# Patient Record
Sex: Male | Born: 1980 | Race: White | Hispanic: No | Marital: Single | Smoking: Current every day smoker
Health system: Southern US, Community
[De-identification: ages and names within clinical notes are randomized; demographics above are authoritative.]

## PROBLEM LIST (undated history)

## (undated) DIAGNOSIS — J40 Bronchitis, not specified as acute or chronic: Secondary | ICD-10-CM

## (undated) DIAGNOSIS — J329 Chronic sinusitis, unspecified: Secondary | ICD-10-CM

## (undated) DIAGNOSIS — K589 Irritable bowel syndrome without diarrhea: Secondary | ICD-10-CM

## (undated) DIAGNOSIS — K219 Gastro-esophageal reflux disease without esophagitis: Secondary | ICD-10-CM

## (undated) DIAGNOSIS — Z87442 Personal history of urinary calculi: Secondary | ICD-10-CM

## (undated) DIAGNOSIS — J45909 Unspecified asthma, uncomplicated: Secondary | ICD-10-CM

## (undated) DIAGNOSIS — J189 Pneumonia, unspecified organism: Secondary | ICD-10-CM

## (undated) HISTORY — DX: Irritable bowel syndrome, unspecified: K58.9

## (undated) HISTORY — DX: Gastro-esophageal reflux disease without esophagitis: K21.9

## (undated) HISTORY — PX: APPENDECTOMY: SHX54

## (undated) HISTORY — DX: Personal history of urinary calculi: Z87.442

---

## 2007-09-09 ENCOUNTER — Emergency Department (HOSPITAL_COMMUNITY): Admission: EM | Admit: 2007-09-09 | Discharge: 2007-09-09 | Payer: Self-pay | Admitting: Family Medicine

## 2011-12-06 ENCOUNTER — Encounter (HOSPITAL_COMMUNITY): Payer: Self-pay

## 2011-12-06 ENCOUNTER — Emergency Department (HOSPITAL_COMMUNITY)
Admission: EM | Admit: 2011-12-06 | Discharge: 2011-12-06 | Disposition: A | Discharge status: Home / Self Care | Payer: PRIVATE HEALTH INSURANCE | Attending: Emergency Medicine | Admitting: Emergency Medicine

## 2011-12-06 DIAGNOSIS — M5380 Other specified dorsopathies, site unspecified: Secondary | ICD-10-CM | POA: Insufficient documentation

## 2011-12-06 DIAGNOSIS — J45909 Unspecified asthma, uncomplicated: Secondary | ICD-10-CM | POA: Insufficient documentation

## 2011-12-06 DIAGNOSIS — IMO0002 Reserved for concepts with insufficient information to code with codable children: Secondary | ICD-10-CM | POA: Insufficient documentation

## 2011-12-06 DIAGNOSIS — R51 Headache: Secondary | ICD-10-CM | POA: Insufficient documentation

## 2011-12-06 DIAGNOSIS — F17200 Nicotine dependence, unspecified, uncomplicated: Secondary | ICD-10-CM | POA: Insufficient documentation

## 2011-12-06 DIAGNOSIS — M62830 Muscle spasm of back: Secondary | ICD-10-CM

## 2011-12-06 DIAGNOSIS — Z8709 Personal history of other diseases of the respiratory system: Secondary | ICD-10-CM | POA: Insufficient documentation

## 2011-12-06 DIAGNOSIS — S40811A Abrasion of right upper arm, initial encounter: Secondary | ICD-10-CM

## 2011-12-06 HISTORY — DX: Chronic sinusitis, unspecified: J32.9

## 2011-12-06 HISTORY — DX: Bronchitis, not specified as acute or chronic: J40

## 2011-12-06 HISTORY — DX: Unspecified asthma, uncomplicated: J45.909

## 2011-12-06 MED ORDER — IBUPROFEN 800 MG PO TABS
800.0000 mg | ORAL_TABLET | Freq: Three times a day (TID) | ORAL | Status: DC | PRN
Start: 2011-12-06 — End: 2012-10-27

## 2011-12-06 MED ORDER — IBUPROFEN 200 MG PO TABS
600.0000 mg | ORAL_TABLET | Freq: Once | ORAL | Status: AC
  Administered 2011-12-06: 600 mg via ORAL
  Filled 2011-12-06: qty 3
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
## 2012-10-27 ENCOUNTER — Encounter (HOSPITAL_COMMUNITY): Payer: Self-pay | Admitting: Emergency Medicine

## 2012-10-27 ENCOUNTER — Emergency Department (HOSPITAL_COMMUNITY)
Admission: EM | Admit: 2012-10-27 | Discharge: 2012-10-28 | Disposition: A | Discharge status: Home / Self Care | Payer: 59 | Attending: Emergency Medicine | Admitting: Emergency Medicine

## 2012-10-27 DIAGNOSIS — F17200 Nicotine dependence, unspecified, uncomplicated: Secondary | ICD-10-CM | POA: Insufficient documentation

## 2012-10-27 DIAGNOSIS — F1010 Alcohol abuse, uncomplicated: Secondary | ICD-10-CM | POA: Insufficient documentation

## 2012-10-27 DIAGNOSIS — J45909 Unspecified asthma, uncomplicated: Secondary | ICD-10-CM | POA: Insufficient documentation

## 2012-10-27 LAB — RAPID URINE DRUG SCREEN, HOSP PERFORMED
Amphetamines: NOT DETECTED
Barbiturates: NOT DETECTED
Benzodiazepines: NOT DETECTED
Cocaine: NOT DETECTED
Opiates: NOT DETECTED
Tetrahydrocannabinol: NOT DETECTED

## 2012-10-27 LAB — COMPREHENSIVE METABOLIC PANEL
Albumin: 4.2 g/dL (ref 3.5–5.2)
BUN: 9 mg/dL (ref 6–23)
Calcium: 9.2 mg/dL (ref 8.4–10.5)
Chloride: 104 mEq/L (ref 96–112)
Creatinine, Ser: 0.76 mg/dL (ref 0.50–1.35)
Total Bilirubin: 0.2 mg/dL — ABNORMAL LOW (ref 0.3–1.2)
Total Protein: 8 g/dL (ref 6.0–8.3)

## 2012-10-27 LAB — CBC WITH DIFFERENTIAL/PLATELET
Hemoglobin: 17.5 g/dL — ABNORMAL HIGH (ref 13.0–17.0)
MCV: 89.7 fL (ref 78.0–100.0)
Platelets: 220 10*3/uL (ref 150–400)
RBC: 5.35 MIL/uL (ref 4.22–5.81)
WBC: 8.8 10*3/uL (ref 4.0–10.5)

## 2012-10-27 MED ORDER — ALUM & MAG HYDROXIDE-SIMETH 200-200-20 MG/5ML PO SUSP: 30.0000 mL | ORAL | Status: DC | PRN

## 2012-10-27 MED ORDER — LORAZEPAM 1 MG PO TABS: 1.0000 mg | ORAL_TABLET | Freq: Three times a day (TID) | ORAL | Status: DC | PRN

## 2012-10-27 MED ORDER — ONDANSETRON HCL 4 MG PO TABS: 4.0000 mg | ORAL_TABLET | Freq: Three times a day (TID) | ORAL | Status: DC | PRN

## 2012-10-27 MED ORDER — IBUPROFEN 400 MG PO TABS: 600.0000 mg | ORAL_TABLET | Freq: Three times a day (TID) | ORAL | Status: DC | PRN

## 2012-10-27 MED ORDER — NICOTINE 21 MG/24HR TD PT24: 21.0000 mg | MEDICATED_PATCH | Freq: Every day | TRANSDERMAL | Status: DC

## 2012-10-27 MED ORDER — ACETAMINOPHEN 325 MG PO TABS: 650.0000 mg | ORAL_TABLET | ORAL | Status: DC | PRN

## 2012-10-27 MED ORDER — ZOLPIDEM TARTRATE 5 MG PO TABS: 5.0000 mg | ORAL_TABLET | Freq: Every evening | ORAL | Status: DC | PRN

## 2012-10-27 NOTE — ED Notes (Addendum)
Pt to ED for alcohol detox.  Pt states that he does not drink everyday but when he does he binge drinks and will drink until he passes out- pt estimates that he drinks on average once a week.  Drink of choice is beer.  Pt last drink today at 5pm, states he started this morning and has had 15 pints of beer.  Patient place in paper scrubs and wanded by security.   Denies SI/HI.

## 2012-10-27 NOTE — ED Notes (Signed)
Pt. requesting detox for alcohol abuse , last drink this afternoon , denies suicidal ideation or hallucinations .

## 2012-10-27 NOTE — ED Provider Notes (Signed)
CSN: [id number]     Arrival date & time 10/27/12  2205 History   First MD Initiated Contact with Patient 10/27/12 2217     Chief Complaint  Patient presents with  . Alcohol Problem   (Consider location/radiation/quality/duration/timing/severity/associated sxs/prior Treatment) HPI Comments: Patient is a 32 y/o male with a history of alcohol abuse and family history of alcohol abuse who presents for detox from alcohol. Patient requesting treatment in an alcohol treatment facility. Patient endorses 6 months of sobriety with relapse in March 2014. Patient states he does not drink every day, but when he does he binge drinks. Patient drinks beer and last drank at 5pm today. Patient states he had 11 16-oz beers today. Denies SI/HI/hallucinations and illicit drug use. Denies hx of seizures or seizure activity secondary to detox.  Patient is a 32 y.o. male presenting with alcohol problem. The history is provided by the patient. No language interpreter was used.  Alcohol Problem Pertinent negatives include no fever.    Past Medical History  Diagnosis Date  . Asthma   . Bronchitis   . Sinus infection    Past Surgical History  Procedure Laterality Date  . Appendectomy     No family history on file. History  Substance Use Topics  . Smoking status: Current Every Day Smoker -- 0.50 packs/day  . Smokeless tobacco: Not on file  . Alcohol Use: Yes    Review of Systems  Constitutional: Negative for fever.  Psychiatric/Behavioral: Negative for suicidal ideas. The patient is nervous/anxious.   All other systems reviewed and are negative.    Allergies  Review of patient's allergies indicates no known allergies.  Home Medications  No current outpatient prescriptions on file. BP 125/83  Pulse 114  Temp(Src) 98.7 F (37.1 C) (Oral)  Resp 20  Ht 5\' 9"  (1.753 m)  Wt 278 lb (126.1 kg)  BMI 41.03 kg/m2  SpO2 96%  Physical Exam  Nursing note and vitals reviewed. Constitutional: He is  oriented to person, place, and time. He appears well-developed and well-nourished. No distress.  Patient tearful  HENT:  Head: Normocephalic and atraumatic.  Mouth/Throat: Oropharynx is clear and moist. No oropharyngeal exudate.  Eyes: Conjunctivae and EOM are normal. No scleral icterus.  Neck: Normal range of motion. Neck supple.  Cardiovascular: Normal rate, regular rhythm and normal heart sounds.   Pulmonary/Chest: Effort normal and breath sounds normal. No respiratory distress. He has no wheezes. He has no rales.  Musculoskeletal: Normal range of motion.  Neurological: He is alert and oriented to person, place, and time.  Skin: Skin is warm and dry. No rash noted. He is not diaphoretic. No erythema. No pallor.  Psychiatric: His speech is normal. Thought content normal. His mood appears anxious. Cognition and memory are normal. He expresses no homicidal and no suicidal ideation. He expresses no suicidal plans and no homicidal plans.  Patient tearful and emotional; appears mildly anxious    ED Course  Procedures (including critical care time) Labs Review Labs Reviewed  ETHANOL - Abnormal; Notable for the following:    Alcohol, Ethyl (B) 158 (*)    All other components within normal limits  COMPREHENSIVE METABOLIC PANEL - Abnormal; Notable for the following:    Glucose, Bld 103 (*)    Total Bilirubin 0.2 (*)    All other components within normal limits  CBC WITH DIFFERENTIAL - Abnormal; Notable for the following:    Hemoglobin 17.5 (*)    MCHC 36.5 (*)    All other components  within normal limits  URINE RAPID DRUG SCREEN (HOSP PERFORMED)   Imaging Review No results found.  MDM   1. Alcohol abuse    32 year old male with a history of alcohol abuse presents for detox from alcohol; requesting placement in alcohol rehabilitation facility. Patient denies history of depression and anxiety you know he states that symptoms of these may contribute to his excessive drinking. Patient  tearful and emotional at bedside. Consults to TTS placed. CIWA protocol and temp psych hold orders placed.  6:00 - Patient seen by ACT and opts for outpatient program. Stable and appropriate for d/c.  Antony Madura, PA-C 10/28/12 0602

## 2012-10-28 NOTE — ED Notes (Signed)
Evaluated by Berna Spare with TTS, given option for inpatient versus outpatient and he opts for intensive outpatient program. Resources and referrals provided  Sunnie Nielsen, MD 10/28/12 [phone]

## 2012-10-28 NOTE — ED Notes (Signed)
TTS team at bedside talking to pt and family.

## 2012-10-28 NOTE — ED Notes (Signed)
Pt has ride home.

## 2012-10-28 NOTE — ED Notes (Signed)
Assessment team called Mathew Hale stated have Pt on their list to be assessed.

## 2012-10-28 NOTE — BH Assessment (Signed)
Assessment Note  Mathew Hale is an 32 y.o. male.  Patient came to [facility] seeking assistance with his binge drinking.  Patient is drinking primarily on nights when he is not working.  He will drink 2-3x/W.  Binging consists of drinking 12-18 during a 24 hour period.  "Many times I will drink until I pass out."  Patient has been binging sporatically for the last 2 years with it getting worse over the last few weeks.  Patient last drank at 17:00 on 10/02.  Patient cites depression and financial problems as reasons that he is drinking.  Patient has no previous detox experience and cites no acute detox symptoms.  Patient has gone for 6 months without drinking and said that things went very well but that he picked up the habit again very readily.  Patient says, "I have to get this addressed or it is going to control me."  Patient denies any SI, HI or A/V hallucinations. Patient is open to doing CD IOP program.  He does not meet criteria for inpatient detox since he can go for a few days without drinking with no acute symptoms. Patient care was discussed with Dr. Dierdre Highman who was in agreement with patient going to a IOP program.  Patient was given a list of programs to choose from.  He was also given a list of AA meetings for the next few days.  Patient said that he is going to call about CD IOP this morning at [facility].  Dr. Dierdre Highman d/c'ed patient home.  Axis I: Alcohol Abuse Axis II: Deferred Axis III:  Past Medical History  Diagnosis Date  . Asthma   . Bronchitis   . Sinus infection    Axis IV: economic problems and other psychosocial or environmental problems Axis V: 51-60 moderate symptoms  Past Medical History:  Past Medical History  Diagnosis Date  . Asthma   . Bronchitis   . Sinus infection     Past Surgical History  Procedure Laterality Date  . Appendectomy      Family History: No family history on file.  Social History:  reports that he has been smoking.  He does not have any  smokeless tobacco history on file. He reports that  drinks alcohol. He reports that he does not use illicit drugs.  Additional Social History:  Alcohol / Drug Use Pain Medications: None Prescriptions: None Over the Counter: N/A History of alcohol / drug use?: Yes Longest period of sobriety (when/how long): 6 months Negative Consequences of Use: Personal relationships Withdrawal Symptoms: Blackouts;Nausea / Vomiting;Tremors;Sweats Substance #1 Name of Substance 1: ETOH, beer 1 - Age of First Use: 26 1 - Amount (size/oz): 18-24 beers when binging 1 - Frequency: 2-3 times per week, nights when he does not have to work. 1 - Duration: Last two years 1 - Last Use / Amount: At 17:00 on 10/02  CIWA: CIWA-Ar BP: 125/83 mmHg Pulse Rate: 114 Nausea and Vomiting: no nausea and no vomiting Tactile Disturbances: none Tremor: no tremor Auditory Disturbances: not present Paroxysmal Sweats: barely perceptible sweating, palms moist Visual Disturbances: not present Anxiety: two Headache, Fullness in Head: none present Agitation: normal activity Orientation and Clouding of Sensorium: oriented and can do serial additions CIWA-Ar Total: 3 COWS:    Allergies: No Known Allergies  Home Medications:  (Not in a hospital admission)  OB/GYN Status:  No LMP for male patient.  General Assessment Data Location of Assessment: [facility] ED Is this a Tele or Face-to-Face Assessment?: Face-to-Face Is  this an Initial Assessment or a Re-assessment for this encounter?: Initial Assessment Living Arrangements: Spouse/significant other;Children Can pt return to current living arrangement?: Yes Admission Status: Voluntary Is patient capable of signing voluntary admission?: Yes Transfer from: Acute Hospital Referral Source: Self/Family/Friend     [facility] Crisis Care Plan Living Arrangements: Spouse/significant other;Children Name of Psychiatrist: N/A Name of Therapist: N/A     Risk to self Suicidal Ideation:  No Suicidal Intent: No Is patient at risk for suicide?: No Suicidal Plan?: No Access to Means: No What has been your use of drugs/alcohol within the last 12 months?: ETOH binging Previous Attempts/Gestures: No How many times?: 0 Other Self Harm Risks: SA issues Triggers for Past Attempts: None known Intentional Self Injurious Behavior: None Family Suicide History: No Recent stressful life event(s): Financial Problems Persecutory voices/beliefs?: No Depression: Yes Depression Symptoms: Despondent;Insomnia;Isolating;Loss of interest in usual pleasures;Feeling worthless/self pity Substance abuse history and/or treatment for substance abuse?: No Suicide prevention information given to non-admitted patients: Not applicable  Risk to Others Homicidal Ideation: No Thoughts of Harm to Others: No Current Homicidal Intent: No Current Homicidal Plan: No Access to Homicidal Means: No Identified Victim: No one History of harm to others?: No Assessment of Violence: None Noted Violent Behavior Description: None Does patient have access to weapons?: No Criminal Charges Pending?: No Does patient have a court date: No  Psychosis Hallucinations: None noted Delusions: None noted  Mental Status Report Appear/Hygiene:  (Casual) Eye Contact: Good Motor Activity: Freedom of movement;Unremarkable Speech: Logical/coherent Level of Consciousness: Alert Mood: Depressed;Helpless Affect: Anxious;Depressed Anxiety Level: Moderate Thought Processes: Coherent;Relevant Judgement: Unimpaired Orientation: Person;Place;Time;Situation Obsessive Compulsive Thoughts/Behaviors: None  Cognitive Functioning Concentration: Normal Memory: Recent Intact;Remote Intact IQ: Average Insight: Good Impulse Control: Poor Appetite: Good Weight Loss: 0 Weight Gain: 0 Sleep: Decreased Total Hours of Sleep:  (<6H/D) Vegetative Symptoms: Staying in bed  ADLScreening [facility] Assessment Services) Patient's  cognitive ability adequate to safely complete daily activities?: Yes Patient able to express need for assistance with ADLs?: Yes Independently performs ADLs?: Yes (appropriate for developmental age)  Prior Inpatient Therapy Prior Inpatient Therapy: No Prior Therapy Dates: N/A Prior Therapy Facilty/Provider(s): N/A Reason for Treatment: N/A  Prior Outpatient Therapy Prior Outpatient Therapy: No Prior Therapy Dates: N/A Prior Therapy Facilty/Provider(s): N/A Reason for Treatment: N/A  ADL Screening (condition at time of admission) Patient's cognitive ability adequate to safely complete daily activities?: Yes Is the patient deaf or have difficulty hearing?: No Does the patient have difficulty seeing, even when wearing glasses/contacts?: No Does the patient have difficulty concentrating, remembering, or making decisions?: No Patient able to express need for assistance with ADLs?: Yes Does the patient have difficulty dressing or bathing?: No Independently performs ADLs?: Yes (appropriate for developmental age) Does the patient have difficulty walking or climbing stairs?: No Weakness of Legs: None Weakness of Arms/Hands: None       Abuse/Neglect Assessment (Assessment to be complete while patient is alone) Physical Abuse: Denies Verbal Abuse: Denies Sexual Abuse: Denies Exploitation of patient/patient's resources: Denies Self-Neglect: Denies     Merchant [employer] officer (For Healthcare) Advance Directive: Patient does not have advance directive;Patient would not like information    Additional Information 1:1 In Past 12 Months?: No CIRT Risk: No Elopement Risk: No Does patient have medical clearance?: Yes     Disposition:  Disposition Initial Assessment Completed for this Encounter: Yes Disposition of Patient: Referred to Patient referred to: ADS;Other (Comment);Outpatient clinic referral (CD IOP programs)  On Site Evaluation by:   Reviewed with Physician:  Beatriz Stallion Ray 10/28/2012 6:45 AM

## 2012-11-03 NOTE — ED Provider Notes (Signed)
Medical screening examination/treatment/procedure(s) were performed by non-physician practitioner and as supervising physician I was immediately available for consultation/collaboration.  Jeremias Broyhill M Venkat Ankney, MD 11/03/12 1506 

## 2015-11-24 ENCOUNTER — Ambulatory Visit (HOSPITAL_COMMUNITY)
Admission: EM | Admit: 2015-11-24 | Discharge: 2015-11-24 | Disposition: A | Discharge status: Home / Self Care | Payer: 59 | Attending: Emergency Medicine | Admitting: Emergency Medicine

## 2015-11-24 ENCOUNTER — Ambulatory Visit (INDEPENDENT_AMBULATORY_CARE_PROVIDER_SITE_OTHER): Payer: 59

## 2015-11-24 ENCOUNTER — Encounter (HOSPITAL_COMMUNITY): Payer: Self-pay | Admitting: Emergency Medicine

## 2015-11-24 DIAGNOSIS — S93492A Sprain of other ligament of left ankle, initial encounter: Secondary | ICD-10-CM

## 2015-11-24 DIAGNOSIS — Z23 Encounter for immunization: Secondary | ICD-10-CM | POA: Diagnosis not present

## 2015-11-24 DIAGNOSIS — M25572 Pain in left ankle and joints of left foot: Secondary | ICD-10-CM | POA: Diagnosis not present

## 2015-11-24 DIAGNOSIS — M7989 Other specified soft tissue disorders: Secondary | ICD-10-CM | POA: Diagnosis not present

## 2015-11-24 DIAGNOSIS — M79672 Pain in left foot: Secondary | ICD-10-CM

## 2015-11-24 DIAGNOSIS — S99922A Unspecified injury of left foot, initial encounter: Secondary | ICD-10-CM | POA: Diagnosis not present

## 2015-11-24 MED ORDER — TETANUS-DIPHTH-ACELL PERTUSSIS 5-2.5-18.5 LF-MCG/0.5 IM SUSP
0.5000 mL | Freq: Once | INTRAMUSCULAR | Status: AC
  Administered 2015-11-24: 0.5 mL via INTRAMUSCULAR

## 2015-11-24 MED ORDER — IBUPROFEN 800 MG PO TABS: 800.0000 mg | ORAL_TABLET | Freq: Three times a day (TID) | ORAL | 0 refills | Status: DC

## 2015-11-24 MED ORDER — HYDROCODONE-ACETAMINOPHEN 5-325 MG PO TABS: 1.0000 | ORAL_TABLET | ORAL | 0 refills | Status: DC | PRN

## 2015-11-24 MED ORDER — TETANUS-DIPHTH-ACELL PERTUSSIS 5-2.5-18.5 LF-MCG/0.5 IM SUSP
INTRAMUSCULAR | Status: AC
  Filled 2015-11-24: qty 0.5

## 2015-11-24 NOTE — ED Triage Notes (Signed)
Patient reports stepping off bottom step to the side walk. Reports left ankle rolled. Landed face first on side walk.  Abrasion to lower lip, chin , left knee and pain in left ankle.  No loc

## 2015-11-24 NOTE — ED Provider Notes (Signed)
HPI  SUBJECTIVE:  Mathew Hale is a 35 y.o. male who presents with left ankle and foot pain, swelling after stepping off some stairs and rolling his left ankle outward. Patient states he fell on his knees, hit his lip and chin on the concrete. He states that these are fine and his primary issue is the ankle and foot pain. He describes it as throbbing, constant, sharp with inversion and eversion. Symptoms are worse with movement, inversion and eversion, weightbearing. No alleviating factors. He tried ibuprofen 400 mg for this. No bruising, numbness, tingling, erythema. No previous history of injury to this ankle, osteoporosis, diabetes, hypertension, substance abuse. Patient is a smoker. PMD: Dr. Jeanie Seweredding. 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  Past Medical History:  Diagnosis Date  . Asthma   . Bronchitis   . Sinus infection     Past Surgical History:  Procedure Laterality Date  . APPENDECTOMY      No family history on file.  Social History  Substance Use Topics  . Smoking status: Current Every Day Smoker    Packs/day: 0.50  . Smokeless tobacco: Not on file  . Alcohol use Yes    No current facility-administered medications for this encounter.   Current Outpatient Prescriptions:  .  HYDROcodone-acetaminophen (NORCO/VICODIN) 5-325 MG tablet, Take 1-2 tablets by mouth every 4 (four) hours as needed for moderate pain., Disp: 20 tablet, Rfl: 0 .  ibuprofen (ADVIL,MOTRIN) 800 MG tablet, Take 1 tablet (800 mg total) by mouth 3 (three) times daily., Disp: 30 tablet, Rfl: 0  No Known Allergies   ROS  As noted in HPI.   Physical Exam  BP 112/74 (BP Location: Left Arm) Comment (BP Location): large cuff  Pulse (!) 125   Temp 98.2 F (36.8 C) (Oral)   Resp 22   SpO2 97%   Constitutional: Well developed, well nourished, no acute distress Eyes:  EOMI, conjunctiva normal bilaterally HENT: Normocephalic, atraumatic,mucus membranes moist Respiratory: Normal inspiratory  effort Cardiovascular: Normal rate GI: nondistended skin: No rash, Bilateral superficial abrasions of his knees. Musculoskeletal: L Ankle Distal fibula NT , Medial malleolus NT   Deltoid ligament medially NT, Proximal fibula NT,  Lateral ligaments  tender, ATFL laterally tender and swollen, posterior tablofibular ligament  NT , Achilles NT, calcaneus  NT,  Proximal 5th metatarsal NT, lateral Midfoot tender, swollen, distal NVI with baseline sensation / motor to foot with CR<2 seconds. - bruising. Pain with inversion/eversion and dorsiflexion.  Ant drawer test stable. Pt able to bear weight in dept.  Neurologic: Alert & oriented x 3, no focal neuro deficits Psychiatric: Speech and behavior appropriate   ED Course   Medications - No data to display  Orders Placed This Encounter  Procedures  . DG Ankle Complete Left    Standing Status:   Standing    Number of Occurrences:   1    Order Specific Question:   Reason for Exam (SYMPTOM  OR DIAGNOSIS REQUIRED)    Answer:   rolled left ankle stepping down step to sidewalk  . DG Foot Complete Left    Standing Status:   Standing    Number of Occurrences:   1    Order Specific Question:   Reason for Exam (SYMPTOM  OR DIAGNOSIS REQUIRED)    Answer:   midfoot tenderness r/o fx  . Apply ASO ankle    Standing Status:   Standing    Number of Occurrences:   1    Order Specific Question:  Laterality    Answer:   Left    No results found for this or any previous visit (from the past 24 hour(s)). Dg Ankle Complete Left  Result Date: 11/24/2015 CLINICAL DATA:  Stepped off bottom step and rolled left foot, with left ankle pain and swelling. Initial encounter. EXAM: LEFT ANKLE COMPLETE - 3+ VIEW COMPARISON:  None. FINDINGS: There is no evidence of fracture or dislocation. The ankle mortise is intact; the interosseous space is within normal limits. No talar tilt or subluxation is seen. The joint spaces are preserved. No significant soft tissue  abnormalities are seen. IMPRESSION: No evidence of fracture or dislocation. Electronically Signed   By: Roanna RaiderJeffery  Chang M.D.   On: 11/24/2015 20:43   Dg Foot Complete Left  Result Date: 11/24/2015 CLINICAL DATA:  Pain after missing a bottom step today. EXAM: LEFT FOOT - COMPLETE 3+ VIEW COMPARISON:  None. FINDINGS: There is no evidence of fracture or dislocation. There is no evidence of arthropathy or other focal bone abnormality. Soft tissues are unremarkable. IMPRESSION: Negative. Electronically Signed   By: Ellery Plunkaniel R Mitchell M.D.   On: 11/24/2015 21:28    ED Clinical Impression  Sprain of anterior talofibular ligament of left ankle, initial encounter  Left foot pain   ED Assessment/Plan  Reviewed imaging independently. No fracture or dislocation ankle. No foot fx. See radiology report for details..  Plan to send home with Norco, ibuprofen 800 mg, ASO. Advised patient to get a cane. Follow-up with Dr. August Saucerean or Dr. Victorino DikeHewitt, orthopedic surgery in a week if no better with conservative treatment.. Work note for 3 days.  Discussed imaging, MDM, plan and followup with patient.  Patient agrees with plan.   Meds ordered this encounter  Medications  . ibuprofen (ADVIL,MOTRIN) 800 MG tablet    Sig: Take 1 tablet (800 mg total) by mouth 3 (three) times daily.    Dispense:  30 tablet    Refill:  0  . HYDROcodone-acetaminophen (NORCO/VICODIN) 5-325 MG tablet    Sig: Take 1-2 tablets by mouth every 4 (four) hours as needed for moderate pain.    Dispense:  20 tablet    Refill:  0    *This clinic note was created using Scientist, clinical (histocompatibility and immunogenetics)Dragon dictation software. Therefore, there may be occasional mistakes despite careful proofreading.  ?   Domenick GongAshley Kawena Lyday, MD 11/25/15 [phone]

## 2015-11-24 NOTE — Discharge Instructions (Signed)
Take the medication as written. Take 1 gram of tylenol with the motrin up to 3 times a day as needed for pain and fever. This is an effective combination for pain. Take the norco only for severe pain. Do not take the tylenol and norco as they both have tylenol in them and too much can hurt your liver. Do not exceed 4 grams of tylenol a day from all sources. Return if you get worse, have a  fever >100.4, or for any concerns.   Go to www.goodrx.com to look up your medications. This will give you a list of where you can find your prescriptions at the most affordable prices.

## 2016-02-11 DIAGNOSIS — K589 Irritable bowel syndrome without diarrhea: Secondary | ICD-10-CM | POA: Diagnosis not present

## 2016-02-11 DIAGNOSIS — R5383 Other fatigue: Secondary | ICD-10-CM | POA: Diagnosis not present

## 2016-02-11 DIAGNOSIS — Z0000 Encounter for general adult medical examination without abnormal findings: Secondary | ICD-10-CM | POA: Diagnosis not present

## 2016-02-11 DIAGNOSIS — K219 Gastro-esophageal reflux disease without esophagitis: Secondary | ICD-10-CM | POA: Diagnosis not present

## 2016-02-11 MED FILL — PANTOPRAZOLE SOD DR 20 MG T: 20 | 30 days supply | Qty: 60 | Fill #0

## 2016-02-21 ENCOUNTER — Encounter: Payer: Self-pay | Admitting: Gastroenterology

## 2016-02-21 ENCOUNTER — Ambulatory Visit (INDEPENDENT_AMBULATORY_CARE_PROVIDER_SITE_OTHER): Payer: 59 | Admitting: Gastroenterology

## 2016-02-21 ENCOUNTER — Other Ambulatory Visit (INDEPENDENT_AMBULATORY_CARE_PROVIDER_SITE_OTHER): Payer: 59

## 2016-02-21 ENCOUNTER — Encounter (INDEPENDENT_AMBULATORY_CARE_PROVIDER_SITE_OTHER): Payer: Self-pay

## 2016-02-21 VITALS — BP 120/88 | HR 104 | Ht 69.0 in | Wt 231.1 lb

## 2016-02-21 DIAGNOSIS — K219 Gastro-esophageal reflux disease without esophagitis: Secondary | ICD-10-CM

## 2016-02-21 DIAGNOSIS — R197 Diarrhea, unspecified: Secondary | ICD-10-CM

## 2016-02-21 LAB — IGA: IGA: 248 mg/dL (ref 68–378)

## 2016-02-21 NOTE — Patient Instructions (Addendum)
Your physician has requested that you go to the basement for the following lab work before leaving today: TTG, IGA, GI pathogen panel.  Continue your Protonix daily.   Patient advised to avoid spicy, acidic, citrus, chocolate, mints, fruit and fruit juices.  Limit the intake of caffeine, alcohol and Soda.  Don't exercise too soon after eating.  Don't lie down within 3-4 hours of eating.  Elevate the head of your bed.  Thank you for choosing me and [facility].  Venita LickMalcolm T. Pleas KochStark, Jr., MD., Clementeen GrahamFACG

## 2016-02-21 NOTE — Progress Notes (Signed)
History of Present Illness: This is a 36 year old male referred by Georgianne Fick, MD for the evaluation of GERD and diarrhea. 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 He reports an intentional 100 pound weight loss over the past year. He made substantial changes in his diet and exercise program to achieve this weight loss. He would like to lose another 15-20 pounds. He notes intermittent diarrhea and gas primarily brought on by spicy foods, jalapeno peppers, broccoli, milk products and other foods. He states prior to beginning his weight loss diet he did not have any difficulties with diarrhea however his diet changed substantially 1 year ago. For several weeks he has noted frequent heartburn and regurgitation symptoms. Symptoms often occur at night. He was placed on pantoprazole 40 mg daily by his PCP and these symptoms have resolved. CMP, CBC, TSH by PCP were unremarkable. Denies abdominal pain, constipation, change in stool caliber, melena, hematochezia, nausea, vomiting, dysphagia, chest pain.   No Known Allergies Outpatient Medications Prior to Visit  Medication Sig Dispense Refill  . HYDROcodone-acetaminophen (NORCO/VICODIN) 5-325 MG tablet Take 1-2 tablets by mouth every 4 (four) hours as needed for moderate pain. 20 tablet 0  . ibuprofen (ADVIL,MOTRIN) 800 MG tablet Take 1 tablet (800 mg total) by mouth 3 (three) times daily. 30 tablet 0   No facility-administered medications prior to visit.    Past Medical History:  Diagnosis Date  . Asthma   . Bronchitis   . GERD (gastroesophageal reflux disease)   . History of kidney stones   . IBS (irritable bowel syndrome)   . Sinus infection    Past Surgical History:  Procedure Laterality Date  . APPENDECTOMY     Social History   Social History  . Marital status: Single    Spouse name: N/A  . Number of children: 3  . Years of education: N/A   Occupational History  . EMT    Social History Main Topics  . Smoking status: Current Every  Day Smoker    Packs/day: 0.50  . Smokeless tobacco: Never Used  . Alcohol use Yes     Comment: 1-2 drinks a week  . Drug use: No  . Sexual activity: Yes   Other Topics Concern  . None   Social History Narrative  . None   Family History  Problem Relation Age of Onset  . Diabetes Father   . Heart disease Father   . Kidney disease Father   . Diabetes Paternal Grandmother     ? colon cancer or rectal cancer  . Liver cancer Paternal Aunt   . Pancreatic cancer Paternal Aunt   . Stomach cancer Neg Hx   . Esophageal cancer Neg Hx       Review of Systems: Pertinent positive and negative review of systems were noted in the above HPI section. All other review of systems were otherwise negative.   Physical Exam: General: Well developed, well nourished, no acute distress Head: Normocephalic and atraumatic Eyes:  sclerae anicteric, EOMI Ears: Normal auditory acuity Mouth: No deformity or lesions Neck: Supple, no masses or thyromegaly Lungs: Clear throughout to auscultation Heart: Regular rate and rhythm; no murmurs, rubs or bruits Abdomen: Soft, non tender and non distended. No masses, hepatosplenomegaly or hernias noted. Normal Bowel sounds Musculoskeletal: Symmetrical with no gross deformities  Skin: No lesions on visible extremities Pulses:  Normal pulses noted Extremities: No clubbing, cyanosis, edema or deformities noted Neurological: Alert oriented x 4, grossly nonfocal Cervical Nodes:  No significant cervical adenopathy Inguinal Nodes: No significant inguinal adenopathy Psychological:  Alert and cooperative. Normal mood and affect  Assessment and Recommendations:  1. GERD, currently well controlled. Continue pantoprazole 40 mg daily. Closely follow all antireflux measures. Consider EGD to rule out Barrett's, esophagitis and other disorders at [facility] in 1 month.  2. Diarrhea. Likely food related. Rule out celiac disease, chronic infections and other disorders. Obtain an  IgA, tTG and GI pathogen panel today. Patient is advised to closely follow which foods precipitate diarrhea and attempt to eliminate them from his diet for the next month. If his diarrhea persists without clear etiology will proceed with colonoscopy. [facility] in 1 month.    cc: Georgianne FickAjith Ramachandran, MD [address] [place] [id number]

## 2016-02-24 LAB — TISSUE TRANSGLUTAMINASE, IGA: Tissue Transglutaminase Ab, IgA: 1 U/mL (ref <4)

## 2016-02-26 ENCOUNTER — Other Ambulatory Visit: Payer: 59

## 2016-02-26 DIAGNOSIS — R197 Diarrhea, unspecified: Secondary | ICD-10-CM

## 2016-02-27 LAB — GASTROINTESTINAL PATHOGEN PANEL PCR
C. DIFFICILE TOX A/B, PCR: NOT DETECTED
CRYPTOSPORIDIUM, PCR: NOT DETECTED
Campylobacter, PCR: NOT DETECTED
E COLI (ETEC) LT/ST, PCR: NOT DETECTED
E COLI (STEC) STX1/STX2, PCR: NOT DETECTED
E coli 0157, PCR: NOT DETECTED
Giardia lamblia, PCR: NOT DETECTED
Norovirus, PCR: NOT DETECTED
ROTAVIRUS, PCR: NOT DETECTED
Salmonella, PCR: NOT DETECTED
Shigella, PCR: NOT DETECTED

## 2016-03-30 ENCOUNTER — Ambulatory Visit: Payer: Self-pay | Admitting: Gastroenterology

## 2016-04-08 DIAGNOSIS — Z0000 Encounter for general adult medical examination without abnormal findings: Secondary | ICD-10-CM | POA: Diagnosis not present

## 2017-03-21 ENCOUNTER — Emergency Department (HOSPITAL_COMMUNITY): Payer: Self-pay

## 2017-03-21 ENCOUNTER — Encounter (HOSPITAL_COMMUNITY): Payer: Self-pay | Admitting: Nurse Practitioner

## 2017-03-21 ENCOUNTER — Emergency Department (HOSPITAL_COMMUNITY)
Admission: EM | Admit: 2017-03-21 | Discharge: 2017-03-21 | Disposition: A | Discharge status: Home / Self Care | Payer: Self-pay | Attending: Emergency Medicine | Admitting: Emergency Medicine

## 2017-03-21 DIAGNOSIS — J45909 Unspecified asthma, uncomplicated: Secondary | ICD-10-CM | POA: Insufficient documentation

## 2017-03-21 DIAGNOSIS — Y9389 Activity, other specified: Secondary | ICD-10-CM | POA: Insufficient documentation

## 2017-03-21 DIAGNOSIS — Y92000 Kitchen of unspecified non-institutional (private) residence as  the place of occurrence of the external cause: Secondary | ICD-10-CM | POA: Insufficient documentation

## 2017-03-21 DIAGNOSIS — F17210 Nicotine dependence, cigarettes, uncomplicated: Secondary | ICD-10-CM | POA: Insufficient documentation

## 2017-03-21 DIAGNOSIS — S42021A Displaced fracture of shaft of right clavicle, initial encounter for closed fracture: Secondary | ICD-10-CM | POA: Insufficient documentation

## 2017-03-21 DIAGNOSIS — Y999 Unspecified external cause status: Secondary | ICD-10-CM | POA: Insufficient documentation

## 2017-03-21 DIAGNOSIS — W010XXA Fall on same level from slipping, tripping and stumbling without subsequent striking against object, initial encounter: Secondary | ICD-10-CM | POA: Insufficient documentation

## 2017-03-21 MED ORDER — OXYCODONE-ACETAMINOPHEN 5-325 MG PO TABS: 1.0000 | ORAL_TABLET | Freq: Four times a day (QID) | ORAL | 0 refills | Status: DC | PRN

## 2017-03-21 MED ORDER — ONDANSETRON HCL 4 MG/2ML IJ SOLN: 4.0000 mg | Freq: Once | INTRAMUSCULAR | Status: DC

## 2017-03-21 MED ORDER — OXYCODONE-ACETAMINOPHEN 5-325 MG PO TABS
2.0000 | ORAL_TABLET | Freq: Once | ORAL | Status: AC
  Administered 2017-03-21: 2 via ORAL
  Filled 2017-03-21: qty 2

## 2017-03-21 MED ORDER — MELOXICAM 15 MG PO TABS: 15.0000 mg | ORAL_TABLET | Freq: Every day | ORAL | 0 refills | Status: DC

## 2017-03-21 MED ORDER — FENTANYL CITRATE (PF) 100 MCG/2ML IJ SOLN: 50.0000 ug | Freq: Once | INTRAMUSCULAR | Status: DC

## 2017-03-21 NOTE — Discharge Instructions (Signed)
Contact a health care provider if: °Your medicine is not helping to relieve pain and swelling. °Get help right away if: °Your arm is numb, cold, or pale, even when your splint is loose. °

## 2017-03-21 NOTE — ED Triage Notes (Signed)
Pt is c/o severe right shoulder pain from a mechanical fall.

## 2017-03-21 NOTE — ED Notes (Signed)
Bed: WTR7 Expected date:  Expected time:  Means of arrival:  Comments: 

## 2017-03-21 NOTE — ED Provider Notes (Signed)
[facility]-EMERGENCY DEPT Provider Note   CSN: [id number] Arrival date & time: 03/21/17  0201     History   Chief Complaint Chief Complaint  Patient presents with  . Shoulder Pain    HPI Mathew Hale is a 37 y.o. male who presents the emergency department with chief complaint of left shoulder injury.  Patient states that he walked into his kitchen in his boots and on the linoleum floor slid falling onto his right shoulder.  He had immediate severe pain and deformity and came to the emergency department for evaluation.  He denies any numbness or tingling in his hand.  He has no previous injuries to the side.  He is left-hand dominant.  HPI  Past Medical History:  Diagnosis Date  . Asthma   . Bronchitis   . GERD (gastroesophageal reflux disease)   . History of kidney stones   . IBS (irritable bowel syndrome)   . Sinus infection     There are no active problems to display for this patient.   Past Surgical History:  Procedure Laterality Date  . APPENDECTOMY         Home Medications    Prior to Admission medications   Medication Sig Start Date End Date Taking? Authorizing Provider  meloxicam (MOBIC) 15 MG tablet Take 1 tablet (15 mg total) by mouth daily. 03/21/17   Arthor Captain, PA-C  oxyCODONE-acetaminophen (PERCOCET) 5-325 MG tablet Take 1 tablet by mouth every 6 (six) hours as needed. 03/21/17   Karenann Mcgrory, Cammy Copa, PA-C  pantoprazole (PROTONIX) 20 MG tablet daily. 02/11/16   [provider]    Family History Family History  Problem Relation Age of Onset  . Diabetes Father   . Heart disease Father   . Kidney disease Father   . Diabetes Paternal Grandmother        ? colon cancer or rectal cancer  . Liver cancer Paternal Aunt   . Pancreatic cancer Paternal Aunt   . Stomach cancer Neg Hx   . Esophageal cancer Neg Hx     Social History Social History   Tobacco Use  . Smoking status: Current Every Day Smoker    Packs/day:  0.50  . Smokeless tobacco: Never Used  Substance Use Topics  . Alcohol use: Yes    Comment: 1-2 drinks a week  . Drug use: No     Allergies   Patient has no known allergies.   Review of Systems Review of Systems  Ten systems reviewed and are negative for acute change, except as noted in the HPI.   Physical Exam Updated Vital Signs BP (!) 126/99 (BP Location: Right Arm)   Pulse 100   Temp 98 F (36.7 C) (Oral)   Resp 18   SpO2 100%   Physical Exam  Constitutional: He appears well-developed and well-nourished. No distress.  HENT:  Head: Normocephalic and atraumatic.  Eyes: Conjunctivae are normal. No scleral icterus.  Neck: Normal range of motion. Neck supple.  Cardiovascular: Normal rate, regular rhythm and normal heart sounds.  Pulmonary/Chest: Effort normal and breath sounds normal. No respiratory distress.  Abdominal: Soft. There is no tenderness.  Musculoskeletal: He exhibits no edema.  No sulcus sign, left clavicle exquisitely tender with some deformity noted proximally.  No tenting of the skin, normal pulse and sensation intact.  Neurological: He is alert.  Skin: Skin is warm and dry. He is not diaphoretic.  Psychiatric: His behavior is normal.  Nursing note and vitals reviewed.  ED Treatments / Results  Labs (all labs ordered are listed, but only abnormal results are displayed) Labs Reviewed - No data to display  EKG  EKG Interpretation None       Radiology Dg Clavicle Right  Result Date: 03/21/2017 CLINICAL DATA:  Slipped and fell tonight.  RIGHT shoulder pain. EXAM: RIGHT SHOULDER - 2+ VIEW; RIGHT CLAVICLE - 2+ VIEWS COMPARISON:  None. FINDINGS: RIGHT shoulder: Acute segmental mid clavicle fracture with superiorly directed fracture apex. No destructive bony lesions. Soft tissue planes are normal. RIGHT shoulder: The humeral head is well-formed and located. The subacromial, glenohumeral and acromioclavicular joint spaces are intact. No destructive  bony lesions. Soft tissue planes are non-suspicious. IMPRESSION: Acute displaced RIGHT clavicle fracture. Electronically Signed   By: Awilda Metroourtnay  Bloomer M.D.   On: 03/21/2017 03:10   Dg Shoulder Right  Result Date: 03/21/2017 CLINICAL DATA:  Slipped and fell tonight.  RIGHT shoulder pain. EXAM: RIGHT SHOULDER - 2+ VIEW; RIGHT CLAVICLE - 2+ VIEWS COMPARISON:  None. FINDINGS: RIGHT shoulder: Acute segmental mid clavicle fracture with superiorly directed fracture apex. No destructive bony lesions. Soft tissue planes are normal. RIGHT shoulder: The humeral head is well-formed and located. The subacromial, glenohumeral and acromioclavicular joint spaces are intact. No destructive bony lesions. Soft tissue planes are non-suspicious. IMPRESSION: Acute displaced RIGHT clavicle fracture. Electronically Signed   By: Awilda Metroourtnay  Bloomer M.D.   On: 03/21/2017 03:10    Procedures Procedures (including critical care time)  Medications Ordered in ED Medications  oxyCODONE-acetaminophen (PERCOCET/ROXICET) 5-325 MG per tablet 2 tablet (2 tablets Oral Given 03/21/17 0405)     Initial Impression / Assessment and Plan / ED Course  I have reviewed the triage vital signs and the nursing notes.  Pertinent labs & imaging results that were available during my care of the patient were reviewed by me and considered in my medical decision making (see chart for details).    Patient with comminuted and displaced closed clavicle fracture.  Patient pain controlled, given a sling immobilizer.  The patient will be discharged with outpatient follow-up with orthopedics.  Appears appropriate for discharge at this time.  Final Clinical Impressions(s) / ED Diagnoses   Final diagnoses:  Displaced fracture of shaft of right clavicle, initial encounter for closed fracture    ED Discharge Orders        Ordered    oxyCODONE-acetaminophen (PERCOCET) 5-325 MG tablet  Every 6 hours PRN     03/21/17 0328    meloxicam (MOBIC) 15 MG  tablet  Daily     03/21/17 0328       Arthor CaptainHarris, Naydeen Speirs, PA-C 03/21/17 [id number]    Shon BatonHorton, Courtney F, MD 03/23/17 [phone]

## 2017-03-23 ENCOUNTER — Ambulatory Visit (INDEPENDENT_AMBULATORY_CARE_PROVIDER_SITE_OTHER): Payer: Self-pay | Admitting: Orthopaedic Surgery

## 2017-03-23 ENCOUNTER — Encounter (INDEPENDENT_AMBULATORY_CARE_PROVIDER_SITE_OTHER): Payer: Self-pay

## 2017-03-23 ENCOUNTER — Encounter (INDEPENDENT_AMBULATORY_CARE_PROVIDER_SITE_OTHER): Payer: Self-pay | Admitting: Orthopaedic Surgery

## 2017-03-23 ENCOUNTER — Telehealth (INDEPENDENT_AMBULATORY_CARE_PROVIDER_SITE_OTHER): Payer: Self-pay | Admitting: Orthopaedic Surgery

## 2017-03-23 DIAGNOSIS — S42021A Displaced fracture of shaft of right clavicle, initial encounter for closed fracture: Secondary | ICD-10-CM

## 2017-03-23 NOTE — Telephone Encounter (Signed)
Can you email him the letter I just put in his chart? [email or web address]

## 2017-03-23 NOTE — Telephone Encounter (Signed)
Patient is scheduled for jury duty on March 28th and was wondering if he could possibly pick up a excuse at his post op scheduled for March 14th. CB #  [phone]

## 2017-03-23 NOTE — Progress Notes (Signed)
Office Visit Note   Patient: Mathew Hale           Date of Birth: October 17, 1980           MRN: [id number] Visit Date: 03/23/2017              Requested by: Georgianne Fick, MD [address] [place], [place] [address] PCP: Georgianne Fick, MD   Assessment & Plan: Visit Diagnoses:  1. Displaced fracture of shaft of right clavicle, initial encounter for closed fracture     Plan: As well as showing him the shoulder and clavicle model.  Due to the I went over his x-rays with him in detail significant comminution of his right clavicle fracture as well as the discomfort this is causing him we are recommending open reduction internal fixation using plates and screws.  I showed him some samples of this and we talked in detail about the risk and benefits of the surgery.  He is someone who needs to get back to work sooner and so fixation can certainly help with this.  I did counsel him about smoking as well.  We will work on getting the surgery scheduled and we will see him back in 2 weeks with a repeat 2 views of his right clavicle.  All questions concerns were answered and addressed.  Follow-Up Instructions: Return for 2 weeks post-op.   Orders:  No orders of the defined types were placed in this encounter.  No orders of the defined types were placed in this encounter.     Procedures: No procedures performed   Clinical Data: No additional findings.   Subjective: Chief Complaint  Patient presents with  . Right Shoulder - Pain, Injury  Patient is a very pleasant 37 year old gentleman who is left-hand dominant.  He is referred from the emergency room after mechanical fall this weekend when he slipped going to his house on wet surface.  He was seen in the [facility] emergency department and found to have a comminuted midshaft clavicle fracture.  This is on the right side.  He was placed in a sling and follow-up in our office as we are on call.  He does perform  significant manual labor.  He is been very uncomfortable with the fracture itself.  He denies any neck pain he denies any other injuries.  He denies any numbness and tingling in his right upper extremity.  He is not a diabetic but he is a smoker.  HPI  Review of Systems He currently denies any headache, chest pain, shortness of breath, fever, chills, nausea, vomiting.  Objective: Vital Signs: There were no vitals taken for this visit.  Physical Exam He is alert and oriented x3 in no acute distress Ortho Exam Examination of his right shoulder does show significant swelling but no soft tissue compromise of the skin.  You can see a deformity from likely a broken clavicle and I can palpate short bone ends.  Distally his motor and sensory exam in his right upper extremity is normal. Specialty Comments:  No specialty comments available.  Imaging: No results found. X-rays independently reviewed show a midshaft comminuted/segmental clavicle fracture on the right side.  PMFS History: Patient Active Problem List   Diagnosis Date Noted  . Displaced fracture of shaft of right clavicle, initial encounter for closed fracture 03/23/2017   Past Medical History:  Diagnosis Date  . Asthma   . Bronchitis   . GERD (gastroesophageal reflux disease)   . History  of kidney stones   . IBS (irritable bowel syndrome)   . Sinus infection     Family History  Problem Relation Age of Onset  . Diabetes Father   . Heart disease Father   . Kidney disease Father   . Diabetes Paternal Grandmother        ? colon cancer or rectal cancer  . Liver cancer Paternal Aunt   . Pancreatic cancer Paternal Aunt   . Stomach cancer Neg Hx   . Esophageal cancer Neg Hx     Past Surgical History:  Procedure Laterality Date  . APPENDECTOMY     Social History   Occupational History  . Occupation: EMT  Tobacco Use  . Smoking status: Current Every Day Smoker    Packs/day: 0.50  . Smokeless tobacco: Never Used    Substance and Sexual Activity  . Alcohol use: Yes    Comment: 1-2 drinks a week  . Drug use: No  . Sexual activity: Yes

## 2017-03-24 ENCOUNTER — Other Ambulatory Visit (INDEPENDENT_AMBULATORY_CARE_PROVIDER_SITE_OTHER): Payer: Self-pay | Admitting: Physician Assistant

## 2017-03-24 ENCOUNTER — Encounter (HOSPITAL_COMMUNITY): Payer: Self-pay | Admitting: *Deleted

## 2017-03-24 ENCOUNTER — Other Ambulatory Visit: Payer: Self-pay

## 2017-03-24 NOTE — Progress Notes (Signed)
Spoke with pt for pre-op call. Pt denies cardiac history, HTN, or diabetes. 

## 2017-03-25 ENCOUNTER — Ambulatory Visit (HOSPITAL_COMMUNITY): Payer: Self-pay | Admitting: Anesthesiology

## 2017-03-25 ENCOUNTER — Ambulatory Visit (HOSPITAL_COMMUNITY): Payer: Self-pay

## 2017-03-25 ENCOUNTER — Encounter (HOSPITAL_COMMUNITY): Payer: Self-pay | Admitting: Anesthesiology

## 2017-03-25 ENCOUNTER — Encounter (HOSPITAL_COMMUNITY)
Admission: RE | Disposition: A | Discharge status: Home / Self Care | Payer: Self-pay | Source: Ambulatory Visit | Attending: Orthopaedic Surgery

## 2017-03-25 ENCOUNTER — Observation Stay (HOSPITAL_COMMUNITY)
Admission: RE | Admit: 2017-03-25 | Discharge: 2017-03-26 | Disposition: A | Discharge status: Home / Self Care | Payer: Self-pay | Source: Ambulatory Visit | Attending: Orthopaedic Surgery | Admitting: Orthopaedic Surgery

## 2017-03-25 DIAGNOSIS — Z419 Encounter for procedure for purposes other than remedying health state, unspecified: Secondary | ICD-10-CM

## 2017-03-25 DIAGNOSIS — S42021A Displaced fracture of shaft of right clavicle, initial encounter for closed fracture: Principal | ICD-10-CM | POA: Diagnosis present

## 2017-03-25 DIAGNOSIS — S42001A Fracture of unspecified part of right clavicle, initial encounter for closed fracture: Secondary | ICD-10-CM | POA: Diagnosis present

## 2017-03-25 DIAGNOSIS — F17210 Nicotine dependence, cigarettes, uncomplicated: Secondary | ICD-10-CM | POA: Insufficient documentation

## 2017-03-25 DIAGNOSIS — J45909 Unspecified asthma, uncomplicated: Secondary | ICD-10-CM | POA: Insufficient documentation

## 2017-03-25 DIAGNOSIS — W010XXA Fall on same level from slipping, tripping and stumbling without subsequent striking against object, initial encounter: Secondary | ICD-10-CM | POA: Insufficient documentation

## 2017-03-25 DIAGNOSIS — K589 Irritable bowel syndrome without diarrhea: Secondary | ICD-10-CM | POA: Insufficient documentation

## 2017-03-25 DIAGNOSIS — Z87442 Personal history of urinary calculi: Secondary | ICD-10-CM | POA: Insufficient documentation

## 2017-03-25 DIAGNOSIS — K219 Gastro-esophageal reflux disease without esophagitis: Secondary | ICD-10-CM | POA: Insufficient documentation

## 2017-03-25 DIAGNOSIS — Y92009 Unspecified place in unspecified non-institutional (private) residence as the place of occurrence of the external cause: Secondary | ICD-10-CM | POA: Insufficient documentation

## 2017-03-25 HISTORY — PX: ORIF CLAVICULAR FRACTURE: SHX5055

## 2017-03-25 HISTORY — DX: Pneumonia, unspecified organism: J18.9

## 2017-03-25 LAB — CBC
HCT: 50.4 % (ref 39.0–52.0)
Hemoglobin: 17.8 g/dL — ABNORMAL HIGH (ref 13.0–17.0)
MCH: 33.8 pg (ref 26.0–34.0)
MCHC: 35.3 g/dL (ref 30.0–36.0)
MCV: 95.8 fL (ref 78.0–100.0)
PLATELETS: 186 10*3/uL (ref 150–400)
RBC: 5.26 MIL/uL (ref 4.22–5.81)
RDW: 12.9 % (ref 11.5–15.5)
WBC: 5.8 10*3/uL (ref 4.0–10.5)

## 2017-03-25 SURGERY — OPEN REDUCTION INTERNAL FIXATION (ORIF) CLAVICULAR FRACTURE
Anesthesia: Regional | Laterality: Right

## 2017-03-25 MED ORDER — LACTATED RINGERS IV SOLN
INTRAVENOUS | Status: DC | PRN
  Administered 2017-03-25 (×2): via INTRAVENOUS

## 2017-03-25 MED ORDER — CHLORHEXIDINE GLUCONATE 4 % EX LIQD: 60.0000 mL | Freq: Once | CUTANEOUS | Status: DC

## 2017-03-25 MED ORDER — ROCURONIUM BROMIDE 100 MG/10ML IV SOLN
INTRAVENOUS | Status: DC | PRN
  Administered 2017-03-25: 10 mg via INTRAVENOUS
  Administered 2017-03-25: 40 mg via INTRAVENOUS

## 2017-03-25 MED ORDER — METOCLOPRAMIDE HCL 5 MG/ML IJ SOLN: 5.0000 mg | Freq: Three times a day (TID) | INTRAMUSCULAR | Status: DC | PRN

## 2017-03-25 MED ORDER — SUGAMMADEX SODIUM 200 MG/2ML IV SOLN
INTRAVENOUS | Status: DC | PRN
  Administered 2017-03-25: 250 mg via INTRAVENOUS

## 2017-03-25 MED ORDER — PROPOFOL 10 MG/ML IV BOLUS
INTRAVENOUS | Status: DC | PRN
  Administered 2017-03-25: 200 mg via INTRAVENOUS

## 2017-03-25 MED ORDER — PANTOPRAZOLE SODIUM 40 MG PO TBEC
40.0000 mg | DELAYED_RELEASE_TABLET | Freq: Every day | ORAL | Status: DC
  Administered 2017-03-25 – 2017-03-26 (×2): 40 mg via ORAL
  Filled 2017-03-25 (×2): qty 1

## 2017-03-25 MED ORDER — OXYCODONE HCL 5 MG PO TABS: 10.0000 mg | ORAL_TABLET | ORAL | Status: DC | PRN

## 2017-03-25 MED ORDER — LACTATED RINGERS IV SOLN
INTRAVENOUS | Status: DC
  Administered 2017-03-25: 10:00:00 via INTRAVENOUS

## 2017-03-25 MED ORDER — DEXAMETHASONE SODIUM PHOSPHATE 4 MG/ML IJ SOLN
INTRAMUSCULAR | Status: DC | PRN
  Administered 2017-03-25: 8 mg via INTRAVENOUS

## 2017-03-25 MED ORDER — CEFAZOLIN SODIUM-DEXTROSE 2-4 GM/100ML-% IV SOLN
2.0000 g | INTRAVENOUS | Status: AC
  Administered 2017-03-25: 2 g via INTRAVENOUS

## 2017-03-25 MED ORDER — MIDAZOLAM HCL 2 MG/2ML IJ SOLN
INTRAMUSCULAR | Status: AC
  Filled 2017-03-25: qty 2

## 2017-03-25 MED ORDER — EPHEDRINE 5 MG/ML INJ
INTRAVENOUS | Status: AC
  Filled 2017-03-25: qty 10

## 2017-03-25 MED ORDER — METHOCARBAMOL 500 MG PO TABS
500.0000 mg | ORAL_TABLET | Freq: Four times a day (QID) | ORAL | Status: DC | PRN
  Administered 2017-03-26: 500 mg via ORAL
  Filled 2017-03-25: qty 1

## 2017-03-25 MED ORDER — PHENYLEPHRINE HCL 10 MG/ML IJ SOLN
INTRAMUSCULAR | Status: DC | PRN
  Administered 2017-03-25: 80 ug via INTRAVENOUS

## 2017-03-25 MED ORDER — MIDAZOLAM HCL 5 MG/5ML IJ SOLN
INTRAMUSCULAR | Status: DC | PRN
  Administered 2017-03-25: 2 mg via INTRAVENOUS

## 2017-03-25 MED ORDER — FENTANYL CITRATE (PF) 250 MCG/5ML IJ SOLN
INTRAMUSCULAR | Status: AC
  Filled 2017-03-25: qty 5

## 2017-03-25 MED ORDER — SUGAMMADEX SODIUM 200 MG/2ML IV SOLN
INTRAVENOUS | Status: AC
  Filled 2017-03-25: qty 4

## 2017-03-25 MED ORDER — SUGAMMADEX SODIUM 200 MG/2ML IV SOLN
INTRAVENOUS | Status: AC
  Filled 2017-03-25: qty 2

## 2017-03-25 MED ORDER — FENTANYL CITRATE (PF) 100 MCG/2ML IJ SOLN
INTRAMUSCULAR | Status: AC
  Filled 2017-03-25: qty 2

## 2017-03-25 MED ORDER — ONDANSETRON HCL 4 MG/2ML IJ SOLN
INTRAMUSCULAR | Status: AC
  Filled 2017-03-25: qty 4

## 2017-03-25 MED ORDER — ONDANSETRON HCL 4 MG PO TABS: 4.0000 mg | ORAL_TABLET | Freq: Four times a day (QID) | ORAL | Status: DC | PRN

## 2017-03-25 MED ORDER — PROPOFOL 10 MG/ML IV BOLUS
INTRAVENOUS | Status: AC
  Filled 2017-03-25: qty 20

## 2017-03-25 MED ORDER — MEPERIDINE HCL 50 MG/ML IJ SOLN: 6.2500 mg | INTRAMUSCULAR | Status: DC | PRN

## 2017-03-25 MED ORDER — FENTANYL CITRATE (PF) 100 MCG/2ML IJ SOLN
INTRAMUSCULAR | Status: DC | PRN
  Administered 2017-03-25 (×3): 50 ug via INTRAVENOUS
  Administered 2017-03-25: 100 ug via INTRAVENOUS

## 2017-03-25 MED ORDER — DIPHENHYDRAMINE HCL 12.5 MG/5ML PO ELIX: 12.5000 mg | ORAL_SOLUTION | ORAL | Status: DC | PRN

## 2017-03-25 MED ORDER — PHENYLEPHRINE HCL 10 MG/ML IJ SOLN
INTRAVENOUS | Status: DC | PRN
  Administered 2017-03-25: 50 ug/min via INTRAVENOUS

## 2017-03-25 MED ORDER — HYDROMORPHONE HCL 1 MG/ML IJ SOLN: 0.2500 mg | INTRAMUSCULAR | Status: DC | PRN

## 2017-03-25 MED ORDER — OXYCODONE HCL 5 MG PO TABS
5.0000 mg | ORAL_TABLET | ORAL | Status: DC | PRN
  Administered 2017-03-26: 10 mg via ORAL
  Filled 2017-03-25: qty 2

## 2017-03-25 MED ORDER — LIDOCAINE HCL (CARDIAC) 20 MG/ML IV SOLN
INTRAVENOUS | Status: DC | PRN
  Administered 2017-03-25: 50 mg via INTRAVENOUS

## 2017-03-25 MED ORDER — ONDANSETRON HCL 4 MG/2ML IJ SOLN: 4.0000 mg | Freq: Four times a day (QID) | INTRAMUSCULAR | Status: DC | PRN

## 2017-03-25 MED ORDER — ACETAMINOPHEN 325 MG PO TABS: 325.0000 mg | ORAL_TABLET | Freq: Four times a day (QID) | ORAL | Status: DC | PRN

## 2017-03-25 MED ORDER — HYDROCODONE-ACETAMINOPHEN 7.5-325 MG PO TABS: 1.0000 | ORAL_TABLET | Freq: Once | ORAL | Status: DC | PRN

## 2017-03-25 MED ORDER — OXYCODONE HCL ER 10 MG PO T12A
10.0000 mg | EXTENDED_RELEASE_TABLET | Freq: Two times a day (BID) | ORAL | Status: DC
  Administered 2017-03-25 – 2017-03-26 (×2): 10 mg via ORAL
  Filled 2017-03-25 (×2): qty 1

## 2017-03-25 MED ORDER — DEXAMETHASONE SODIUM PHOSPHATE 10 MG/ML IJ SOLN
INTRAMUSCULAR | Status: AC
  Filled 2017-03-25: qty 1

## 2017-03-25 MED ORDER — SODIUM CHLORIDE 0.9 % IV SOLN
INTRAVENOUS | Status: DC
  Administered 2017-03-25: 19:00:00 via INTRAVENOUS

## 2017-03-25 MED ORDER — ACETAMINOPHEN 10 MG/ML IV SOLN: 1000.0000 mg | Freq: Once | INTRAVENOUS | Status: DC | PRN

## 2017-03-25 MED ORDER — METOCLOPRAMIDE HCL 5 MG PO TABS: 5.0000 mg | ORAL_TABLET | Freq: Three times a day (TID) | ORAL | Status: DC | PRN

## 2017-03-25 MED ORDER — PROMETHAZINE HCL 25 MG/ML IJ SOLN: 6.2500 mg | INTRAMUSCULAR | Status: DC | PRN

## 2017-03-25 MED ORDER — CEFAZOLIN SODIUM-DEXTROSE 1-4 GM/50ML-% IV SOLN
1.0000 g | Freq: Four times a day (QID) | INTRAVENOUS | Status: AC
  Administered 2017-03-25 – 2017-03-26 (×3): 1 g via INTRAVENOUS
  Filled 2017-03-25 (×5): qty 50

## 2017-03-25 MED ORDER — DEXTROSE 5 % IV SOLN
500.0000 mg | Freq: Four times a day (QID) | INTRAVENOUS | Status: DC | PRN
  Filled 2017-03-25: qty 5

## 2017-03-25 MED ORDER — ROCURONIUM BROMIDE 10 MG/ML (PF) SYRINGE
PREFILLED_SYRINGE | INTRAVENOUS | Status: AC
  Filled 2017-03-25: qty 10

## 2017-03-25 MED ORDER — HYDROMORPHONE HCL 1 MG/ML IJ SOLN: 0.5000 mg | INTRAMUSCULAR | Status: DC | PRN

## 2017-03-25 MED ORDER — 0.9 % SODIUM CHLORIDE (POUR BTL) OPTIME
TOPICAL | Status: DC | PRN
Start: 2017-03-25 — End: 2017-03-25
  Administered 2017-03-25: 1000 mL

## 2017-03-25 MED ORDER — CEFAZOLIN SODIUM-DEXTROSE 2-4 GM/100ML-% IV SOLN
INTRAVENOUS | Status: AC
  Filled 2017-03-25: qty 100

## 2017-03-25 MED ORDER — ROPIVACAINE HCL 7.5 MG/ML IJ SOLN
INTRAMUSCULAR | Status: DC | PRN
  Administered 2017-03-25: 20 mL via PERINEURAL

## 2017-03-25 MED ORDER — PHENYLEPHRINE 40 MCG/ML (10ML) SYRINGE FOR IV PUSH (FOR BLOOD PRESSURE SUPPORT)
PREFILLED_SYRINGE | INTRAVENOUS | Status: AC
  Filled 2017-03-25: qty 10

## 2017-03-25 MED ORDER — EPHEDRINE SULFATE 50 MG/ML IJ SOLN
INTRAMUSCULAR | Status: DC | PRN
  Administered 2017-03-25: 5 mg via INTRAVENOUS

## 2017-03-25 MED ORDER — DOCUSATE SODIUM 100 MG PO CAPS
100.0000 mg | ORAL_CAPSULE | Freq: Two times a day (BID) | ORAL | Status: DC
  Administered 2017-03-25 – 2017-03-26 (×2): 100 mg via ORAL
  Filled 2017-03-25 (×2): qty 1

## 2017-03-25 SURGICAL SUPPLY — 63 items
BIT DRILL 2.6 (BIT) ×3 IMPLANT
CLOSURE WOUND 1/2 X4 (GAUZE/BANDAGES/DRESSINGS) ×1
COVER SURGICAL LIGHT HANDLE (MISCELLANEOUS) ×3 IMPLANT
DRAPE C-ARM 42X72 X-RAY (DRAPES) ×3 IMPLANT
DRAPE IMP U-DRAPE 54X76 (DRAPES) ×3 IMPLANT
DRAPE INCISE IOBAN 66X45 STRL (DRAPES) IMPLANT
DRAPE ORTHO SPLIT 77X108 STRL (DRAPES) ×4
DRAPE SURG 17X23 STRL (DRAPES) ×3 IMPLANT
DRAPE SURG ORHT 6 SPLT 77X108 (DRAPES) ×2 IMPLANT
DRAPE U-SHAPE 47X51 STRL (DRAPES) ×3 IMPLANT
DRILL 2.6X220MM LONG AO (BIT) ×3 IMPLANT
DRSG EMULSION OIL 3X3 NADH (GAUZE/BANDAGES/DRESSINGS) ×3 IMPLANT
DURAPREP 26ML APPLICATOR (WOUND CARE) ×3 IMPLANT
ELECT REM PT RETURN 9FT ADLT (ELECTROSURGICAL) ×3
ELECTRODE REM PT RTRN 9FT ADLT (ELECTROSURGICAL) ×1 IMPLANT
GAUZE SPONGE 4X4 12PLY STRL (GAUZE/BANDAGES/DRESSINGS) ×3 IMPLANT
GAUZE XEROFORM 1X8 LF (GAUZE/BANDAGES/DRESSINGS) ×3 IMPLANT
GLOVE BIO SURGEON STRL SZ8 (GLOVE) ×3 IMPLANT
GLOVE BIOGEL PI IND STRL 6.5 (GLOVE) ×3 IMPLANT
GLOVE BIOGEL PI IND STRL 7.5 (GLOVE) ×1 IMPLANT
GLOVE BIOGEL PI IND STRL 8 (GLOVE) ×1 IMPLANT
GLOVE BIOGEL PI INDICATOR 6.5 (GLOVE) ×6
GLOVE BIOGEL PI INDICATOR 7.5 (GLOVE) ×2
GLOVE BIOGEL PI INDICATOR 8 (GLOVE) ×2
GLOVE ORTHO TXT STRL SZ7.5 (GLOVE) ×3 IMPLANT
GLOVE SURG SS PI 6.5 STRL IVOR (GLOVE) ×9 IMPLANT
GLOVE SURG SS PI 7.5 STRL IVOR (GLOVE) ×3 IMPLANT
GOWN STRL REUS W/ TWL LRG LVL3 (GOWN DISPOSABLE) ×3 IMPLANT
GOWN STRL REUS W/ TWL XL LVL3 (GOWN DISPOSABLE) ×3 IMPLANT
GOWN STRL REUS W/TWL LRG LVL3 (GOWN DISPOSABLE) ×6
GOWN STRL REUS W/TWL XL LVL3 (GOWN DISPOSABLE) ×6
K-WIRE 1.6X150 (WIRE) ×4
K-WIRE FX150X1.6XKRSH (WIRE) ×2
KIT BASIN OR (CUSTOM PROCEDURE TRAY) ×3 IMPLANT
KIT ROOM TURNOVER OR (KITS) ×3 IMPLANT
KWIRE FX150X1.6XKRSH (WIRE) ×2 IMPLANT
MANIFOLD NEPTUNE II (INSTRUMENTS) ×3 IMPLANT
NEEDLE HYPO 25GX1X1/2 BEV (NEEDLE) IMPLANT
NS IRRIG 1000ML POUR BTL (IV SOLUTION) ×3 IMPLANT
PACK SHOULDER (CUSTOM PROCEDURE TRAY) ×3 IMPLANT
PACK UNIVERSAL I (CUSTOM PROCEDURE TRAY) ×3 IMPLANT
PAD ABD 8X10 STRL (GAUZE/BANDAGES/DRESSINGS) ×3 IMPLANT
PAD ARMBOARD 7.5X6 YLW CONV (MISCELLANEOUS) ×6 IMPLANT
PLATE SUPERIOR MIDSHAFT BRG 8H (Plate) ×3 IMPLANT
PUTTY DBX 2.5CC (Putty) ×3 IMPLANT
PUTTY DBX 2.5CC DEPUY (Putty) ×1 IMPLANT
SCREW BONE 3.5X16MM (Screw) ×6 IMPLANT
SCREW BONE 3.5X18MM (Screw) ×9 IMPLANT
SCREW LOCKING 16MM (Screw) ×3 IMPLANT
SCREW LOCKING 3.5X22 (Screw) ×3 IMPLANT
SLING ARM FOAM STRAP XLG (SOFTGOODS) ×3 IMPLANT
SPONGE LAP 18X18 X RAY DECT (DISPOSABLE) ×6 IMPLANT
STRIP CLOSURE SKIN 1/2X4 (GAUZE/BANDAGES/DRESSINGS) ×2 IMPLANT
SUCTION FRAZIER HANDLE 10FR (MISCELLANEOUS) ×2
SUCTION TUBE FRAZIER 10FR DISP (MISCELLANEOUS) ×1 IMPLANT
SUT ETHILON 3 0 PS 1 (SUTURE) ×3 IMPLANT
SUT MNCRL AB 4-0 PS2 18 (SUTURE) ×3 IMPLANT
SUT PROLENE 3 0 PS 1 (SUTURE) ×3 IMPLANT
SUT VIC AB 2-0 CT1 27 (SUTURE) ×2
SUT VIC AB 2-0 CT1 TAPERPNT 27 (SUTURE) ×1 IMPLANT
SYR CONTROL 10ML LL (SYRINGE) IMPLANT
WATER STERILE IRR 1000ML POUR (IV SOLUTION) IMPLANT
YANKAUER SUCT BULB TIP NO VENT (SUCTIONS) ×3 IMPLANT

## 2017-03-25 NOTE — H&P (Signed)
Mathew Hale is an 37 y.o. male.   Chief Complaint: Right shoulder pain with known clavicle fracture HPI: Patient is a very pleasant left-hand-dominant 37 year old gentleman who fell several days ago slipping on a wet floor in his home landing on outstretched right arm injuring his right shoulder.  He was seen in the emergency room and x-rays showed a displaced and comminuted right clavicle fracture.  He now presents for operative intervention of this significant displaced and painful injury.  Past Medical History:  Diagnosis Date  . Asthma   . Bronchitis   . GERD (gastroesophageal reflux disease)   . History of kidney stones   . IBS (irritable bowel syndrome)   . Pneumonia   . Sinus infection     Past Surgical History:  Procedure Laterality Date  . APPENDECTOMY      Family History  Problem Relation Age of Onset  . Diabetes Father   . Heart disease Father   . Kidney disease Father   . Hearing loss Mother   . Diabetes Paternal Grandmother        ? colon cancer or rectal cancer  . Liver cancer Paternal Aunt   . Pancreatic cancer Paternal Aunt   . Stomach cancer Neg Hx   . Esophageal cancer Neg Hx    Social History:  reports that he has been smoking.  He has been smoking about 0.50 packs per day. he has never used smokeless tobacco. He reports that he drinks alcohol. He reports that he does not use drugs.  Allergies: No Known Allergies  Medications Prior to Admission  Medication Sig Dispense Refill  . acetaminophen (TYLENOL) 500 MG tablet Take 500 mg by mouth every 8 (eight) hours as needed for mild pain or moderate pain.    . meloxicam (MOBIC) 15 MG tablet Take 1 tablet (15 mg total) by mouth daily. (Patient not taking: Reported on 03/23/2017) 30 tablet 0  . oxyCODONE-acetaminophen (PERCOCET) 5-325 MG tablet Take 1 tablet by mouth every 6 (six) hours as needed. (Patient not taking: Reported on 03/23/2017) 12 tablet 0    Results for orders placed or performed during the  hospital encounter of 03/25/17 (from the past 48 hour(s))  CBC     Status: Abnormal   Collection Time: 03/25/17  9:49 AM  Result Value Ref Range   WBC 5.8 4.0 - 10.5 K/uL   RBC 5.26 4.22 - 5.81 MIL/uL   Hemoglobin 17.8 (H) 13.0 - 17.0 g/dL   HCT 16.1 09.6 - 04.5 %   MCV 95.8 78.0 - 100.0 fL   MCH 33.8 26.0 - 34.0 pg   MCHC 35.3 30.0 - 36.0 g/dL   RDW 40.9 81.1 - 91.4 %   Platelets 186 150 - 400 K/uL    Comment: Performed at [facility], [address] [place], [place] [address]   No results found.  Review of Systems  All other systems reviewed and are negative.   Blood pressure (!) 127/92, pulse 96, temperature 98.3 F (36.8 C), temperature source Oral, resp. rate 18, height 5\' 9"  (1.753 m), weight 240 lb (108.9 kg), SpO2 98 %. Physical Exam  Constitutional: He is oriented to person, place, and time. He appears well-developed and well-nourished.  HENT:  Head: Normocephalic and atraumatic.  Eyes: EOM are normal. Pupils are equal, round, and reactive to light.  Neck: Normal range of motion. Neck supple.  Cardiovascular: Normal rate and regular rhythm.  Respiratory: Effort normal and breath sounds normal.  GI: Soft.  Bowel sounds are normal.  Musculoskeletal:       Right shoulder: He exhibits tenderness, bony tenderness, swelling, crepitus and deformity.  Neurological: He is alert and oriented to person, place, and time.  Skin: Skin is warm and dry.  Psychiatric: He has a normal mood and affect.     Assessment/Plan Displaced and comminuted right clavicle shaft fracture  The patient understands fully that we recommended surgery to stabilize his right clavicle.  He understands that this is with plates and screws.  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  We will then be admitting him overnight for IV antibiotics and pain control.  Informed consent is  obtained.  Kathryne Hitchhristopher Y Sharline Lehane, MD 03/25/2017, 11:09 AM

## 2017-03-25 NOTE — Anesthesia Postprocedure Evaluation (Signed)
Anesthesia Post Note  Patient: [facility]  Procedure(s) Performed: OPEN REDUCTION INTERNAL FIXATION (ORIF) RIGHT CLAVICLE FRACTURE (Right )     Patient location during evaluation: PACU Anesthesia Type: Regional and General Level of consciousness: awake and alert Pain management: pain level controlled Vital Signs Assessment: post-procedure vital signs reviewed and stable Respiratory status: spontaneous breathing, nonlabored ventilation, respiratory function stable and patient connected to nasal cannula oxygen Cardiovascular status: blood pressure returned to baseline and stable Postop Assessment: no apparent nausea or vomiting Anesthetic complications: no    Last Vitals:  Vitals:   03/25/17 1400 03/25/17 1408  BP:  111/81  Pulse: (!) 116 (!) 104  Resp: 19 (!) 28  Temp:    SpO2: 98% 99%    Last Pain:  Vitals:   03/25/17 1012  TempSrc:   PainSc: 3                  Trevor IhaStephen A Houser

## 2017-03-25 NOTE — Anesthesia Preprocedure Evaluation (Addendum)
Anesthesia Evaluation  Patient identified by MRN, date of birth, ID band Patient awake    Reviewed: Allergy & Precautions, NPO status , Patient's Chart, lab work & pertinent test results  Airway Mallampati: II  TM Distance: >3 FB Neck ROM: Full    Dental no notable dental hx.    Pulmonary asthma , Current Smoker,    Pulmonary exam normal breath sounds clear to auscultation       Cardiovascular negative cardio ROS Normal cardiovascular exam Rhythm:Regular Rate:Normal     Neuro/Psych    GI/Hepatic   Endo/Other    Renal/GU      Musculoskeletal   Abdominal   Peds  Hematology   Anesthesia Other Findings   Reproductive/Obstetrics                            Anesthesia Physical Anesthesia Plan  ASA: II  Anesthesia Plan: Regional and General   Post-op Pain Management:    Induction: Intravenous  PONV Risk Score and Plan: Treatment may vary due to age or medical condition  Airway Management Planned: Oral ETT  Additional Equipment:   Intra-op Plan:   Post-operative Plan: Extubation in OR  Informed Consent: I have reviewed the patients History and Physical, chart, labs and discussed the procedure including the risks, benefits and alternatives for the proposed anesthesia with the patient or authorized representative who has indicated his/her understanding and acceptance.   Dental advisory given  Plan Discussed with: CRNA  Anesthesia Plan Comments:         Anesthesia Quick Evaluation

## 2017-03-25 NOTE — Transfer of Care (Signed)
Immediate Anesthesia Transfer of Care Note  [facility]  Procedure(s) Performed: OPEN REDUCTION INTERNAL FIXATION (ORIF) RIGHT CLAVICLE FRACTURE (Right )  Patient Location: PACU  Anesthesia Type:General  Level of Consciousness: awake, alert  and oriented  Airway & Oxygen Therapy: Patient Spontanous Breathing and Patient connected to nasal cannula oxygen  Post-op Assessment: Report given to RN, Post -op Vital signs reviewed and stable and Patient moving all extremities X 4  Post vital signs: Reviewed and stable  Last Vitals:  Vitals:   03/25/17 0952  BP: (!) 127/92  Pulse: 96  Resp: 18  Temp: 36.8 C  SpO2: 98%    Last Pain:  Vitals:   03/25/17 1012  TempSrc:   PainSc: 3       Patients Stated Pain Goal: 3 (03/25/17 1012)  Complications: No apparent anesthesia complications

## 2017-03-25 NOTE — Anesthesia Procedure Notes (Signed)
Procedure Name: Intubation Date/Time: 03/25/2017 11:51 AM Performed by: Neldon Newport, CRNA Pre-anesthesia Checklist: Timeout performed, Patient being monitored, Suction available, Emergency Drugs available and Patient identified Patient Re-evaluated:Patient Re-evaluated prior to induction Oxygen Delivery Method: Circle system utilized Preoxygenation: Pre-oxygenation with 100% oxygen Induction Type: IV induction Ventilation: Mask ventilation without difficulty Laryngoscope Size: Mac and 3 Grade View: Grade I Tube type: Oral Tube size: 7.5 mm Number of attempts: 1 Placement Confirmation: breath sounds checked- equal and bilateral,  positive ETCO2 and ETT inserted through vocal cords under direct vision Secured at: 23 cm Tube secured with: Tape Dental Injury: Teeth and Oropharynx as per pre-operative assessment

## 2017-03-25 NOTE — Anesthesia Procedure Notes (Signed)
Anesthesia Regional Block: Interscalene brachial plexus block   Pre-Anesthetic Checklist: ,, timeout performed, Correct Patient, Correct Site, Correct Laterality, Correct Procedure, Correct Position, site marked, Risks and benefits discussed, at surgeon's request and post-op pain management  Laterality: Upper and Right  Prep: chloraprep, alcohol swabs       Needles:  Injection technique: Single-shot  Needle Type: Echogenic Needle     Needle Length: 5cm  Needle Gauge: 22     Additional Needles:   Narrative:  Start time: 03/25/2017 11:28 AM End time: 03/25/2017 11:34 AM  Performed by: Personally   Additional Notes: Block assessed prior to start of surgery

## 2017-03-25 NOTE — Brief Op Note (Signed)
03/25/2017  1:18 PM  PATIENT:  Mathew Hale  37 y.o. male  PRE-OPERATIVE DIAGNOSIS:  right clavicle fracture  POST-OPERATIVE DIAGNOSIS:  right clavicle fracture  PROCEDURE:  Procedure(s): OPEN REDUCTION INTERNAL FIXATION (ORIF) RIGHT CLAVICLE FRACTURE (Right)  SURGEON:  Surgeon(s) and Role:    Kathryne Hitch* Gudrun Axe Y, MD - Primary  PHYSICIAN ASSISTANT: Rexene EdisonGil Clark, PA-C  ANESTHESIA:   general  COUNTS:  YES  DICTATION: .Other Dictation: Dictation Number [id number]  PLAN OF CARE: Admit for overnight observation  PATIENT DISPOSITION:  PACU - hemodynamically stable.   Delay start of Pharmacological VTE agent (>24hrs) due to surgical blood loss or risk of bleeding: no

## 2017-03-26 ENCOUNTER — Telehealth (INDEPENDENT_AMBULATORY_CARE_PROVIDER_SITE_OTHER): Payer: Self-pay | Admitting: Orthopaedic Surgery

## 2017-03-26 ENCOUNTER — Encounter (HOSPITAL_COMMUNITY): Payer: Self-pay | Admitting: Orthopaedic Surgery

## 2017-03-26 MED ORDER — OXYCODONE-ACETAMINOPHEN 5-325 MG PO TABS: 1.0000 | ORAL_TABLET | ORAL | 0 refills | Status: DC | PRN

## 2017-03-26 NOTE — Telephone Encounter (Signed)
Aware that he had surgery and this is why we are prescribing it this way

## 2017-03-26 NOTE — Progress Notes (Signed)
Patient discharged home with sister. AVS and script reviewed and given to patient. All belongings sent with patient. VSS. BP 115/69 (BP Location: Left Arm)   Pulse 91   Temp 98.2 F (36.8 C) (Oral)   Resp 20   Ht 5\' 9"  (1.753 m)   Wt 108.9 kg (240 lb)   SpO2 98%   BMI 35.44 kg/m

## 2017-03-26 NOTE — Discharge Summary (Signed)
Patient ID: Mathew Hale MRN: [id number] DOB/AGE: 37-Mar-1982 36 y.o.  Admit date: 03/25/2017 Discharge date: 03/26/2017  Admission Diagnoses:  Principal Problem:   Displaced fracture of shaft of right clavicle, initial encounter for closed fracture Active Problems:   Right clavicle fracture   Discharge Diagnoses:  Same  Past Medical History:  Diagnosis Date  . Asthma   . Bronchitis   . GERD (gastroesophageal reflux disease)   . History of kidney stones   . IBS (irritable bowel syndrome)   . Pneumonia   . Sinus infection     Surgeries: Procedure(s): OPEN REDUCTION INTERNAL FIXATION (ORIF) RIGHT CLAVICLE FRACTURE on 03/25/2017   Consultants:   Discharged Condition: Improved  Hospital Course: Mathew Hale is an 37 y.o. male who was admitted 03/25/2017 for operative treatment ofDisplaced fracture of shaft of right clavicle, initial encounter for closed fracture. Patient has severe unremitting pain that affects sleep, daily activities, and work/hobbies. After pre-op clearance the patient was taken to the operating room on 03/25/2017 and underwent  Procedure(s): OPEN REDUCTION INTERNAL FIXATION (ORIF) RIGHT CLAVICLE FRACTURE.    Patient was given perioperative antibiotics:  Anti-infectives (From admission, onward)   Start     Dose/Rate Route Frequency Ordered Stop   03/25/17 1700  ceFAZolin (ANCEF) IVPB 1 g/50 mL premix     1 g 100 mL/hr over 30 Minutes Intravenous Every 6 hours 03/25/17 1629 03/26/17 1059   03/25/17 1130  ceFAZolin (ANCEF) IVPB 2g/100 mL premix     2 g 200 mL/hr over 30 Minutes Intravenous On call to O.R. 03/25/17 1124 03/25/17 1155   03/25/17 1002  ceFAZolin (ANCEF) 2-4 GM/100ML-% IVPB    Comments:  Launa Flightavy, Adrian   : cabinet override      03/25/17 1002 03/25/17 2214       Patient was given sequential compression devices, early ambulation, and chemoprophylaxis to prevent DVT.  Patient benefited maximally from hospital stay and there were no  complications.    Recent vital signs:  Patient Vitals for the past 24 hrs:  BP Temp Temp src Pulse Resp SpO2 Height Weight  03/26/17 0427 97/65 97.9 F (36.6 C) Oral 80 18 98 % - -  03/25/17 2027 101/62 98.4 F (36.9 C) Oral 93 18 98 % - -  03/25/17 1616 110/80 99.2 F (37.3 C) Oral 96 18 98 % - -  03/25/17 1510 - (!) 97.2 F (36.2 C) - - - - - -  03/25/17 1508 118/83 - - 96 (!) 22 95 % - -  03/25/17 1500 - - - 93 (!) 22 97 % - -  03/25/17 1453 115/89 - - 95 (!) 21 95 % - -  03/25/17 1445 - - - 97 (!) 21 96 % - -  03/25/17 1438 120/83 - - 99 (!) 23 95 % - -  03/25/17 1430 - - - (!) 101 (!) 24 96 % - -  03/25/17 1423 109/82 - - (!) 104 (!) 31 96 % - -  03/25/17 1415 - - - (!) 106 19 95 % - -  03/25/17 1408 111/81 - - (!) 104 (!) 28 99 % - -  03/25/17 1400 - - - (!) 116 19 98 % - -  03/25/17 1354 - 97.9 F (36.6 C) - - - - - -  03/25/17 1353 119/85 - - (!) 119 (!) 32 95 % - -  03/25/17 0952 (!) 127/92 98.3 F (36.8 C) Oral 96 18 98 % 5\' 9"  (1.753 m) 240  lb (108.9 kg)     Recent laboratory studies:  Recent Labs    03/25/17 0949  WBC 5.8  HGB 17.8*  HCT 50.4  PLT 186     Discharge Medications:   Allergies as of 03/26/2017   No Known Allergies     Medication List    STOP taking these medications   meloxicam 15 MG tablet Commonly known as:  MOBIC     TAKE these medications   acetaminophen 500 MG tablet Commonly known as:  TYLENOL Take 500 mg by mouth every 8 (eight) hours as needed for mild pain or moderate pain.   oxyCODONE-acetaminophen 5-325 MG tablet Commonly known as:  PERCOCET Take 1-2 tablets by mouth every 4 (four) hours as needed. What changed:    how much to take  when to take this       Diagnostic Studies: Dg Clavicle Right  Result Date: 03/25/2017 CLINICAL DATA:  Open reduction and internal fixation of right clavicular fracture. EXAM: DG C-ARM 61-120 MIN; RIGHT CLAVICLE - 2+ VIEWS FLUOROSCOPY TIME:  38 seconds. COMPARISON:  Radiographs of  March 21, 2017. FINDINGS: Four intraoperative fluoroscopic images demonstrate surgical internal fixation of displaced right clavicular fracture. Good alignment of fracture components is noted. IMPRESSION: Status post surgical internal fixation of right clavicular fracture. Electronically Signed   By: Lupita Raider, M.D.   On: 03/25/2017 13:22   Dg Clavicle Right  Result Date: 03/21/2017 CLINICAL DATA:  Slipped and fell tonight.  RIGHT shoulder pain. EXAM: RIGHT SHOULDER - 2+ VIEW; RIGHT CLAVICLE - 2+ VIEWS COMPARISON:  None. FINDINGS: RIGHT shoulder: Acute segmental mid clavicle fracture with superiorly directed fracture apex. No destructive bony lesions. Soft tissue planes are normal. RIGHT shoulder: The humeral head is well-formed and located. The subacromial, glenohumeral and acromioclavicular joint spaces are intact. No destructive bony lesions. Soft tissue planes are non-suspicious. IMPRESSION: Acute displaced RIGHT clavicle fracture. Electronically Signed   By: Awilda Metro M.D.   On: 03/21/2017 03:10   Dg Shoulder Right  Result Date: 03/21/2017 CLINICAL DATA:  Slipped and fell tonight.  RIGHT shoulder pain. EXAM: RIGHT SHOULDER - 2+ VIEW; RIGHT CLAVICLE - 2+ VIEWS COMPARISON:  None. FINDINGS: RIGHT shoulder: Acute segmental mid clavicle fracture with superiorly directed fracture apex. No destructive bony lesions. Soft tissue planes are normal. RIGHT shoulder: The humeral head is well-formed and located. The subacromial, glenohumeral and acromioclavicular joint spaces are intact. No destructive bony lesions. Soft tissue planes are non-suspicious. IMPRESSION: Acute displaced RIGHT clavicle fracture. Electronically Signed   By: Awilda Metro M.D.   On: 03/21/2017 03:10   Dg C-arm 1-60 Min  Result Date: 03/25/2017 CLINICAL DATA:  Open reduction and internal fixation of right clavicular fracture. EXAM: DG C-ARM 61-120 MIN; RIGHT CLAVICLE - 2+ VIEWS FLUOROSCOPY TIME:  38 seconds.  COMPARISON:  Radiographs of March 21, 2017. FINDINGS: Four intraoperative fluoroscopic images demonstrate surgical internal fixation of displaced right clavicular fracture. Good alignment of fracture components is noted. IMPRESSION: Status post surgical internal fixation of right clavicular fracture. Electronically Signed   By: Lupita Raider, M.D.   On: 03/25/2017 13:22    Disposition: 01-Home or Self Care    Follow-up Information    Kathryne Hitch, MD. Schedule an appointment as soon as possible for a visit in 2 week(s).   Specialty:  Orthopedic Surgery Contact information: [address] [place] [address] [phone]            Signed: Kathryne Hitch 03/26/2017,  6:44 AM

## 2017-03-26 NOTE — Telephone Encounter (Signed)
[facility] requesting a change to the frequency on the percocet, they can only get it fill if it's taken 1 tablet every 6 hours. CB # [phone]

## 2017-03-26 NOTE — Discharge Instructions (Signed)
Come out of your sling as comfort allows. Expect swelling - ice as needed. No heavy lifting or reaching overhead with your right arm. You can get your dressing wet in the shower daily. Change your dressing once in about 6 days.

## 2017-03-26 NOTE — Progress Notes (Signed)
Subjective: 1 Day Post-Op Procedure(s) (LRB): OPEN REDUCTION INTERNAL FIXATION (ORIF) RIGHT CLAVICLE FRACTURE (Right) Patient reports pain as moderate.    Objective: Vital signs in last 24 hours: Temp:  [97.2 F (36.2 C)-99.2 F (37.3 C)] 97.9 F (36.6 C) (03/01 0427) Pulse Rate:  [80-119] 80 (03/01 0427) Resp:  [18-32] 18 (03/01 0427) BP: (97-127)/(62-92) 97/65 (03/01 0427) SpO2:  [95 %-99 %] 98 % (03/01 0427) Weight:  [240 lb (108.9 kg)] 240 lb (108.9 kg) (02/28 0952)  Intake/Output from previous day: 02/28 0701 - 03/01 0700 In: 1865 [P.O.:280; I.V.:1585] Out: 2400 [Urine:2200; Blood:200] Intake/Output this shift: Total I/O In: -  Out: 1450 [Urine:1450]  Recent Labs    03/25/17 0949  HGB 17.8*   Recent Labs    03/25/17 0949  WBC 5.8  RBC 5.26  HCT 50.4  PLT 186   No results for input(s): NA, K, CL, CO2, BUN, CREATININE, GLUCOSE, CALCIUM in the last 72 hours. No results for input(s): LABPT, INR in the last 72 hours.  Sensation intact distally Intact pulses distally  Assessment/Plan: 1 Day Post-Op Procedure(s) (LRB): OPEN REDUCTION INTERNAL FIXATION (ORIF) RIGHT CLAVICLE FRACTURE (Right) Discharge to home today.  Kathryne HitchChristopher Y Syon Tews 03/26/2017, 6:41 AM

## 2017-03-26 NOTE — Plan of Care (Signed)
  Progressing Education: Knowledge of General Education information will improve 03/26/2017 0819 - Progressing by Quentin CornwallMadison, Donalyn Schneeberger, RN Health Behavior/Discharge Planning: Ability to manage health-related needs will improve 03/26/2017 0819 - Progressing by Quentin CornwallMadison, Mickael Mcnutt, RN Clinical Measurements: Ability to maintain clinical measurements within normal limits will improve 03/26/2017 0819 - Progressing by Quentin CornwallMadison, Ilyana Manuele, RN Will remain free from infection 03/26/2017 0819 - Progressing by Quentin CornwallMadison, Greyson Riccardi, RN Diagnostic test results will improve 03/26/2017 0819 - Progressing by Quentin CornwallMadison, Kabella Cassidy, RN Respiratory complications will improve 03/26/2017 0819 - Progressing by Quentin CornwallMadison, Airyanna Dipalma, RN Cardiovascular complication will be avoided 03/26/2017 0819 - Progressing by Quentin CornwallMadison, Malena Timpone, RN Activity: Risk for activity intolerance will decrease 03/26/2017 0819 - Progressing by Quentin CornwallMadison, Keelyn Monjaras, RN Nutrition: Adequate nutrition will be maintained 03/26/2017 0819 - Progressing by Quentin CornwallMadison, Chrstopher Malenfant, RN Coping: Level of anxiety will decrease 03/26/2017 0819 - Progressing by Quentin CornwallMadison, Majour Frei, RN Elimination: Will not experience complications related to bowel motility 03/26/2017 0819 - Progressing by Quentin CornwallMadison, Tezra Mahr, RN Will not experience complications related to urinary retention 03/26/2017 0819 - Progressing by Quentin CornwallMadison, Ahmiya Abee, RN Pain Managment: General experience of comfort will improve 03/26/2017 0819 - Progressing by Quentin CornwallMadison, Tarnisha Kachmar, RN Safety: Ability to remain free from injury will improve 03/26/2017 0819 - Progressing by Quentin CornwallMadison, Marjan Rosman, RN Skin Integrity: Risk for impaired skin integrity will decrease 03/26/2017 0819 - Progressing by Quentin CornwallMadison, Trayon Krantz, RN Education: Knowledge of the prescribed therapeutic regimen will improve 03/26/2017 0819 - Progressing by Quentin CornwallMadison, Yulissa Needham, RN Understanding of activity limitations/precautions following surgery will improve 03/26/2017 0819 - Progressing by Quentin CornwallMadison, Croix Presley,  RN Activity: Ability to tolerate increased activity will improve 03/26/2017 0819 - Progressing by Quentin CornwallMadison, Natania Finigan, RN Pain Management: Pain level will decrease with appropriate interventions 03/26/2017 0819 - Progressing by Quentin CornwallMadison, Kiyani Jernigan, RN

## 2017-03-26 NOTE — Op Note (Signed)
NAME:  Mathew Hale, Mathew Hale                    ACCOUNT NO.:  MEDICAL RECORD NO.:  [id number]  LOCATION:                                 FACILITY:  PHYSICIAN:  Vanita Panda. Magnus Ivan, M.D.DATE OF BIRTH:  [birth year]  DATE OF PROCEDURE:  03/25/2017 DATE OF DISCHARGE:                              OPERATIVE REPORT   PREOPERATIVE DIAGNOSIS:  Right displaced and comminuted midshaft clavicle fracture.  POSTOPERATIVE DIAGNOSIS:  Right displaced and comminuted midshaft clavicle fracture.  PROCEDURE:  Open reduction and internal fixation of right midshaft clavicle fracture using plates and screws.  IMPLANTS: 1. Stryker 8-hole right clavicle bridge plate with bicortical and     locking screws. 2. Supplemental bone graft with allograft DBX bone graft.  SURGEON:  Vanita Panda. Magnus Ivan, MD.  ASSISTANT:  Richardean Canal, PA-C.  ANESTHESIA:  General.  BLOOD LOSS:  Less than 100 mL.  COMPLICATIONS:  None.  INDICATIONS:  Mathew Hale is a very pleasant 37 year old gentleman, who sustained a mechanical fall less than a week ago landing hard on his left shoulder.  He sustained a comminuted midshaft clavicle fracture with significant displacement.  Due to significance of his pain as well as displacement of fracture combined with his job responsibilities performing significant heavy manual labor, we elected to proceed with open reduction internal fixation of this fracture.  The full discussion of risks and benefits of surgery was had with him and he did wish to proceed.  PROCEDURE DESCRIPTION:  After informed consent was obtained, appropriate right clavicle was marked.  He was brought to the operating room and placed supine on the operating table.  General anesthesia was then obtained.  His right clavicle area was prepped and draped with DuraPrep and sterile drapes.  A time-out was called and he was identified as correct patient and correct right clavicle.  I then made incision over the  clavicle carried this medially and laterally.  We dissected down the fracture and found a highly comminuted fracture.  Removed fracture debris and irrigated the fracture itself.  We then reduced as best we could knowing that we needed to place a bridge plate due to the significant comminution.  We chose a Stryker 8-hole right clavicle bridge plate and secured this with bicortical screws medially and laterally with 2 of these being locking screws on either end.  We did this under direct visualization and direct fluoroscopy.  Once we were pleased with the fracture reduction, we then irrigated the wound with normal saline solution.  We then packed our DBX allograft bone putty around the fracture site.  We then closed the deep tissue over the plate with 0 Vicryl, followed by 2-0 Vicryl in the subcutaneous tissue, interrupted nylon on the skin.  Xeroform and well-padded sterile dressing were applied.  His arm was placed in a sling.  He was awakened, extubated, and taken to the recovery room in stable condition.  All final counts were correct.  There were no complications noted.  Of note, Richardean Canal, P.A.-C, assisted in the entire case.  His assistance was crucial for facilitating all aspects of this case.     Vanita Panda. Magnus Ivan, M.D.  CYB/MEDQ  D:  03/25/2017  T:  03/25/2017  Job:  [id number]

## 2017-04-08 ENCOUNTER — Ambulatory Visit (INDEPENDENT_AMBULATORY_CARE_PROVIDER_SITE_OTHER): Payer: Self-pay

## 2017-04-08 ENCOUNTER — Encounter (INDEPENDENT_AMBULATORY_CARE_PROVIDER_SITE_OTHER): Payer: Self-pay | Admitting: Orthopaedic Surgery

## 2017-04-08 ENCOUNTER — Ambulatory Visit (INDEPENDENT_AMBULATORY_CARE_PROVIDER_SITE_OTHER): Payer: Self-pay | Admitting: Orthopaedic Surgery

## 2017-04-08 DIAGNOSIS — S42021A Displaced fracture of shaft of right clavicle, initial encounter for closed fracture: Secondary | ICD-10-CM

## 2017-04-08 MED ORDER — OXYCODONE-ACETAMINOPHEN 5-325 MG PO TABS: 1.0000 | ORAL_TABLET | ORAL | 0 refills | Status: DC | PRN

## 2017-04-08 NOTE — Progress Notes (Signed)
The patient is 2 weeks status post open reduction and fixation of a comminuted midshaft clavicle fracture of the right clavicle.  He is doing well.  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  He is a smoker and I counseled about this and how this can affect bone healing.  He is more comfortable than what he was preop.  On exam I removed all sutures from his right clavicle area placed Steri-Strips.  His axillary nerve is functioning correctly.  2 views of the clavicle were obtained and I went over these with him in detail showing him the plate overriding the bone with her significant comminution the middle we did place bone graft in this area.  His most lateral screws were [facility].  At this point to slowly increase his activities with no heavy lifting and overhead activities.  We will see him back in 4 weeks with a repeat 2 views of the right clavicle.  I did refill his oxycodone.

## 2017-04-20 ENCOUNTER — Telehealth (INDEPENDENT_AMBULATORY_CARE_PROVIDER_SITE_OTHER): Payer: Self-pay | Admitting: Orthopaedic Surgery

## 2017-04-20 NOTE — Telephone Encounter (Signed)
Please advise 

## 2017-04-20 NOTE — Telephone Encounter (Signed)
Patient requesting rx refill on oxycodone 5-325. # [phone]

## 2017-04-21 MED ORDER — OXYCODONE-ACETAMINOPHEN 5-325 MG PO TABS: 1.0000 | ORAL_TABLET | Freq: Four times a day (QID) | ORAL | 0 refills | Status: AC | PRN

## 2017-04-21 NOTE — Telephone Encounter (Signed)
Sent in to his pharmacy

## 2017-04-30 ENCOUNTER — Telehealth (INDEPENDENT_AMBULATORY_CARE_PROVIDER_SITE_OTHER): Payer: Self-pay | Admitting: Orthopaedic Surgery

## 2017-04-30 ENCOUNTER — Other Ambulatory Visit (INDEPENDENT_AMBULATORY_CARE_PROVIDER_SITE_OTHER): Payer: Self-pay | Admitting: Orthopaedic Surgery

## 2017-04-30 MED ORDER — HYDROCODONE-ACETAMINOPHEN 5-325 MG PO TABS: 1.0000 | ORAL_TABLET | Freq: Four times a day (QID) | ORAL | 0 refills | Status: DC | PRN

## 2017-04-30 NOTE — Telephone Encounter (Signed)
IC patient back. No answer. LMVM advising per Dr Magnus IvanBlackman.

## 2017-04-30 NOTE — Telephone Encounter (Signed)
Ok to refill 

## 2017-04-30 NOTE — Telephone Encounter (Signed)
At this point I want him off of oxycodone, so I did escribe in some hydrocodone in to his pharmacy [facility])

## 2017-04-30 NOTE — Telephone Encounter (Signed)
Patient called requesting an RX refill on his Oxycodone.  CB#[phone].  Thank you.

## 2017-05-10 ENCOUNTER — Ambulatory Visit (INDEPENDENT_AMBULATORY_CARE_PROVIDER_SITE_OTHER): Payer: Self-pay

## 2017-05-10 ENCOUNTER — Ambulatory Visit (INDEPENDENT_AMBULATORY_CARE_PROVIDER_SITE_OTHER): Payer: Self-pay | Admitting: Orthopaedic Surgery

## 2017-05-10 ENCOUNTER — Encounter (INDEPENDENT_AMBULATORY_CARE_PROVIDER_SITE_OTHER): Payer: Self-pay | Admitting: Orthopaedic Surgery

## 2017-05-10 DIAGNOSIS — S42001D Fracture of unspecified part of right clavicle, subsequent encounter for fracture with routine healing: Secondary | ICD-10-CM

## 2017-05-10 NOTE — Progress Notes (Signed)
The patient is now between 6 and 7 weeks status post open reduction internal fixation of a right comminuted midshaft clavicle fracture.  He says he is doing better every week.  He said there is still some shoulder pain but his mobility is increasing on the right side.  The right side his incision is healed nicely.  He can fully abduct and forward flex his right shoulder as well.  X-rays reviewed and show no evidence of hardware failure or complicating features other than the most lateral screw is potentially in the [facility] itself.  This is not look that way on intraoperative fluoroscopic images.  There is been interval healing with no other evidence of hardware failure.  I did share with him his x-ray findings.  I do feel that once he is healed his clavicle fracture we may need to go the most lateral screw.  He is doing well overall.  All questions concerns were answered and addressed.  We will see him back in 4 weeks with potentially a final 2 views of the right clavicle.

## 2017-05-17 ENCOUNTER — Telehealth (INDEPENDENT_AMBULATORY_CARE_PROVIDER_SITE_OTHER): Payer: Self-pay | Admitting: Orthopaedic Surgery

## 2017-05-17 ENCOUNTER — Other Ambulatory Visit (INDEPENDENT_AMBULATORY_CARE_PROVIDER_SITE_OTHER): Payer: Self-pay | Admitting: Orthopaedic Surgery

## 2017-05-17 MED ORDER — HYDROCODONE-ACETAMINOPHEN 5-325 MG PO TABS
1.0000 | ORAL_TABLET | Freq: Four times a day (QID) | ORAL | 0 refills | Status: AC | PRN
Start: 2017-05-17 — End: ?

## 2017-05-17 NOTE — Telephone Encounter (Signed)
I sent in some more to his pharmacy just now.

## 2017-05-17 NOTE — Telephone Encounter (Signed)
Patient called asking for a refill on his hydrocodone. CB # [phone]

## 2017-05-17 NOTE — Telephone Encounter (Signed)
Last filled #40 Hydrocodone on 4/05 - please advise on refill.

## 2017-06-09 ENCOUNTER — Ambulatory Visit (INDEPENDENT_AMBULATORY_CARE_PROVIDER_SITE_OTHER): Payer: Self-pay | Admitting: Orthopaedic Surgery

## 2018-09-26 IMAGING — CR DG SHOULDER 2+V*R*
2 series · 2 of 2 positions shown · non-contrast
Comparison: None.

CLINICAL DATA: Slipped and fell tonight.  RIGHT shoulder pain.

EXAM:
RIGHT SHOULDER - 2+ VIEW; RIGHT CLAVICLE - 2+ VIEWS

[w shoulder external right]
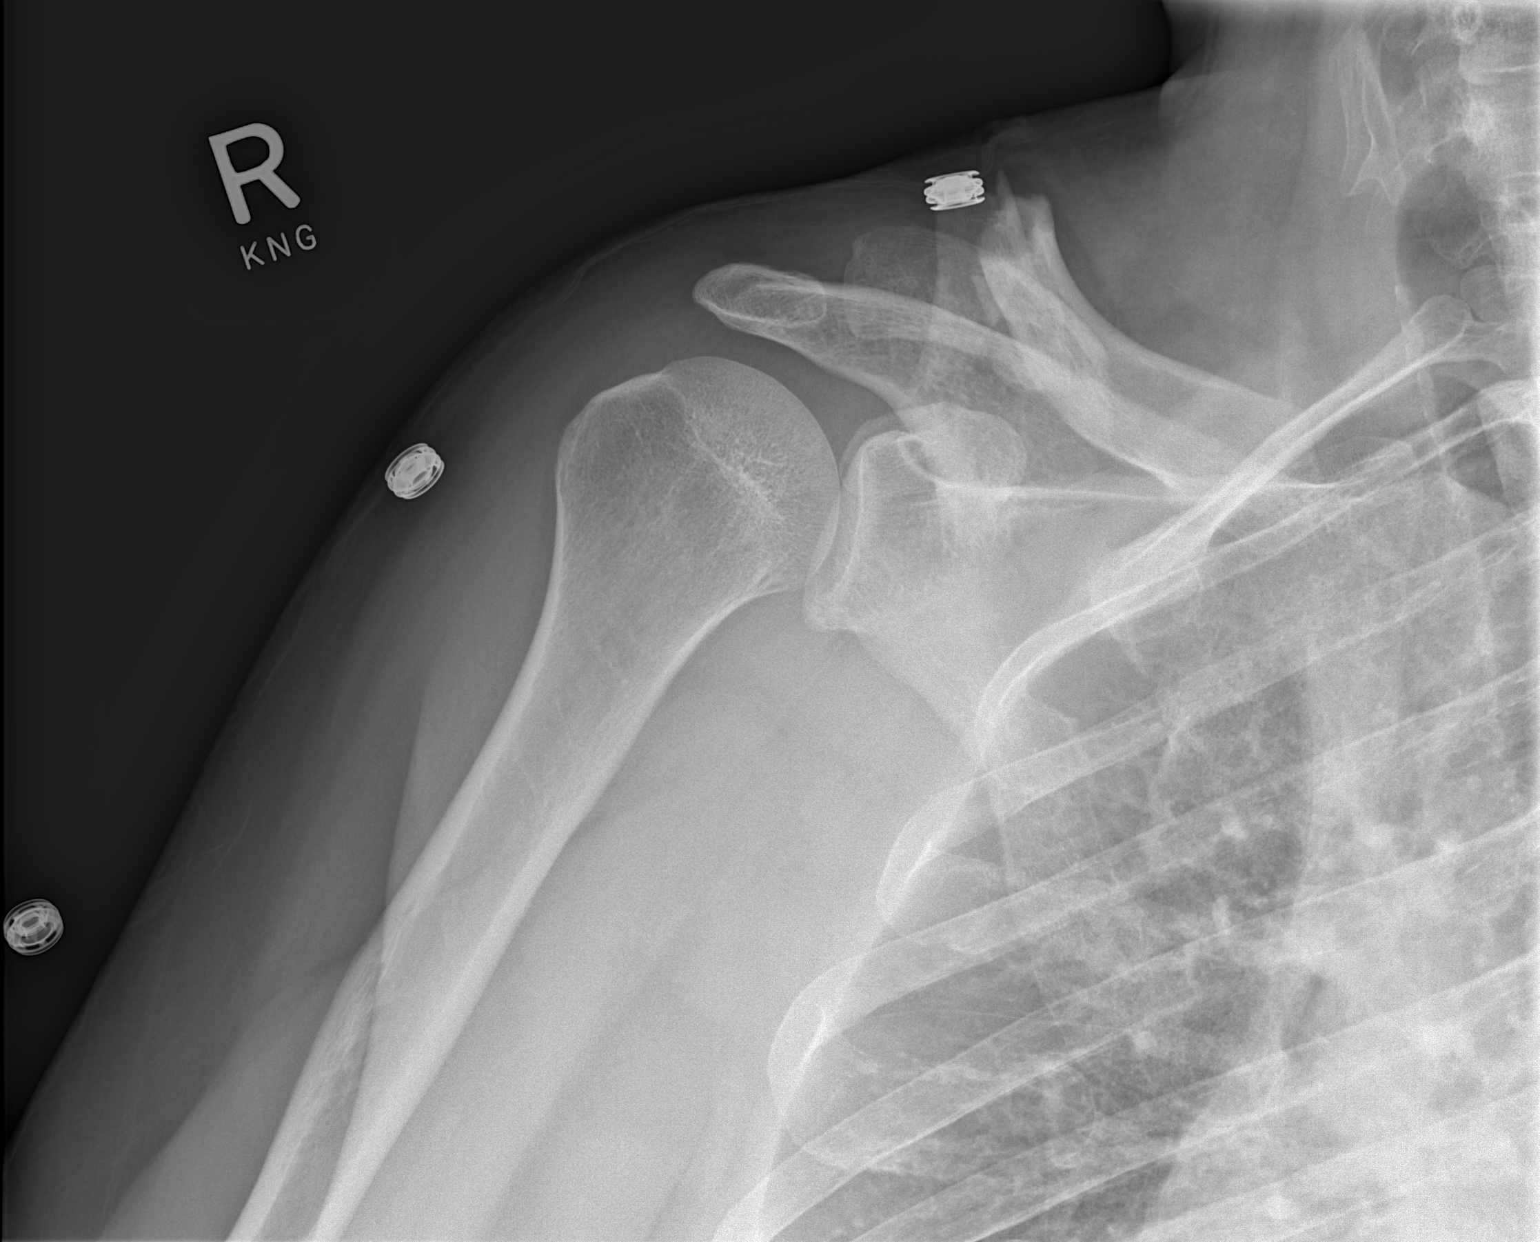

[w shoulder y-view right]
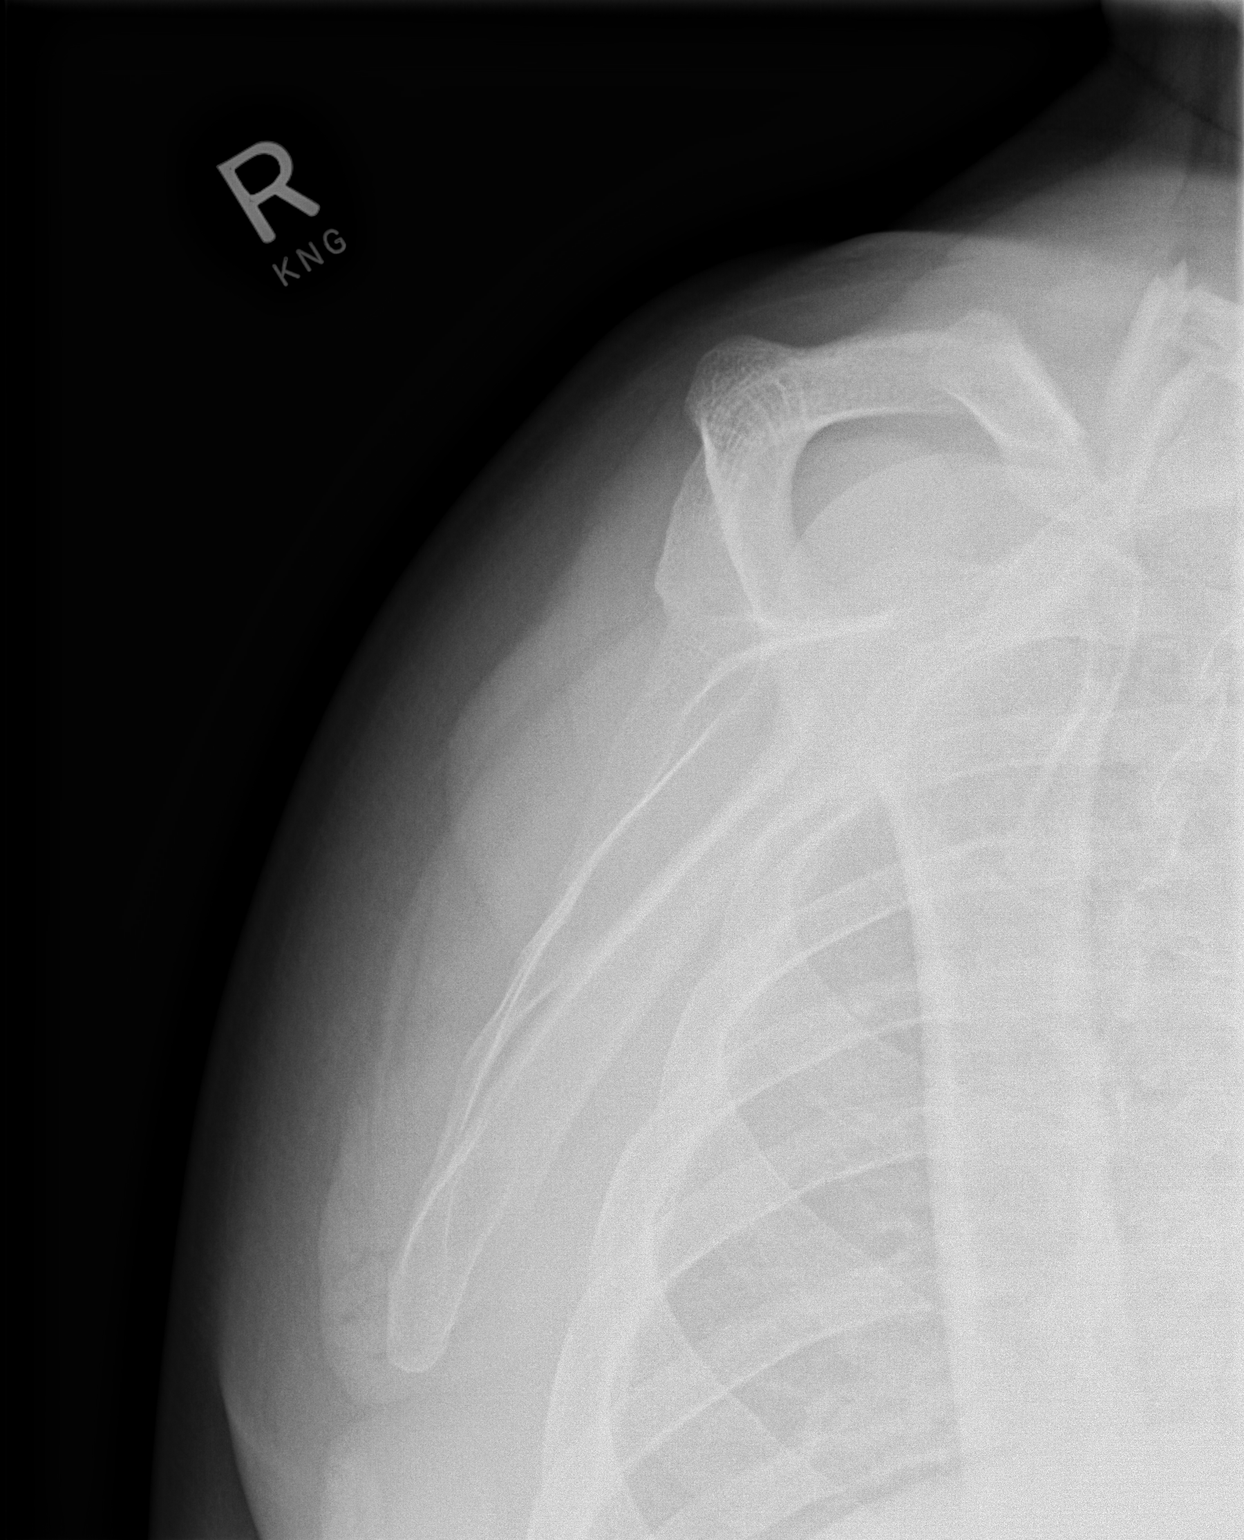

[2 of 2 positions shown; findings below may reference images not displayed]

FINDINGS: RIGHT shoulder: Acute segmental mid clavicle fracture with
superiorly directed fracture apex. No destructive bony lesions. Soft
tissue planes are normal.

RIGHT shoulder: The humeral head is well-formed and located. The
subacromial, glenohumeral and acromioclavicular joint spaces are
intact. No destructive bony lesions. Soft tissue planes are
non-suspicious.
IMPRESSION: Acute displaced RIGHT clavicle fracture.

## 2018-09-30 IMAGING — RF DG CLAVICLE*R*
1 series · 4 of 4 positions shown · non-contrast
Comparison: Radiographs March 21, 2017.

CLINICAL DATA: Open reduction and internal fixation of right
clavicular fracture.

EXAM:
DG C-ARM 61-120 MIN; RIGHT CLAVICLE - 2+ VIEWS
FLUOROSCOPY TIME:  38 seconds.

[Series 1: run · 4 of 4 slices shown]
[im 1/4]
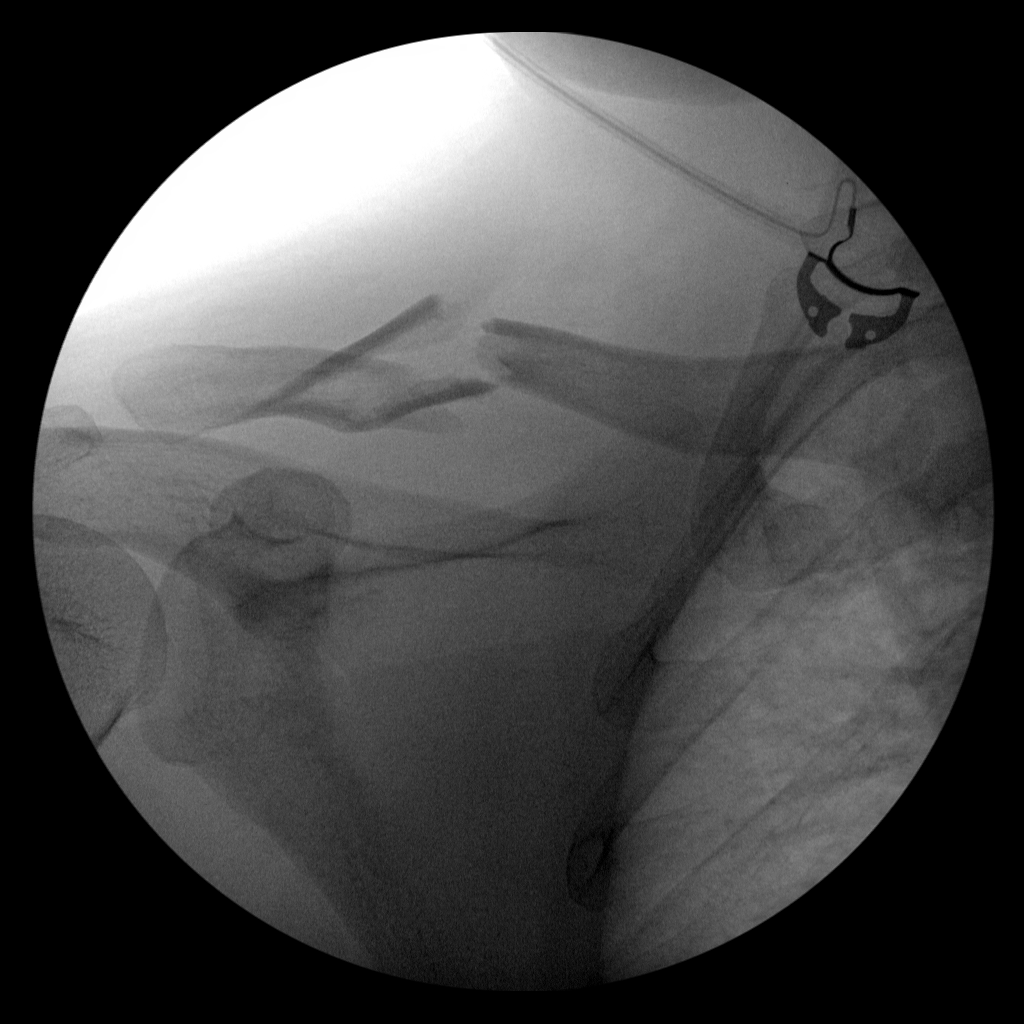
[im 2/4]
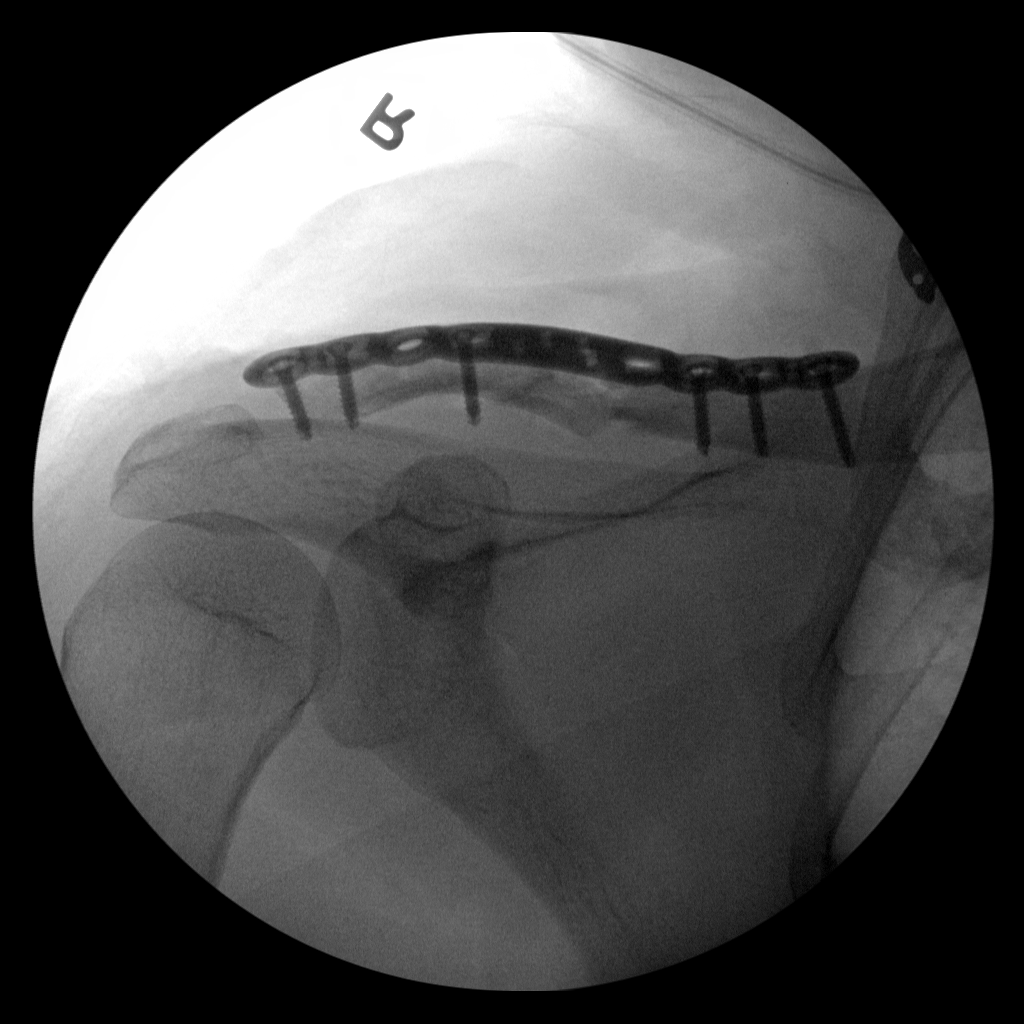
[im 3/4]
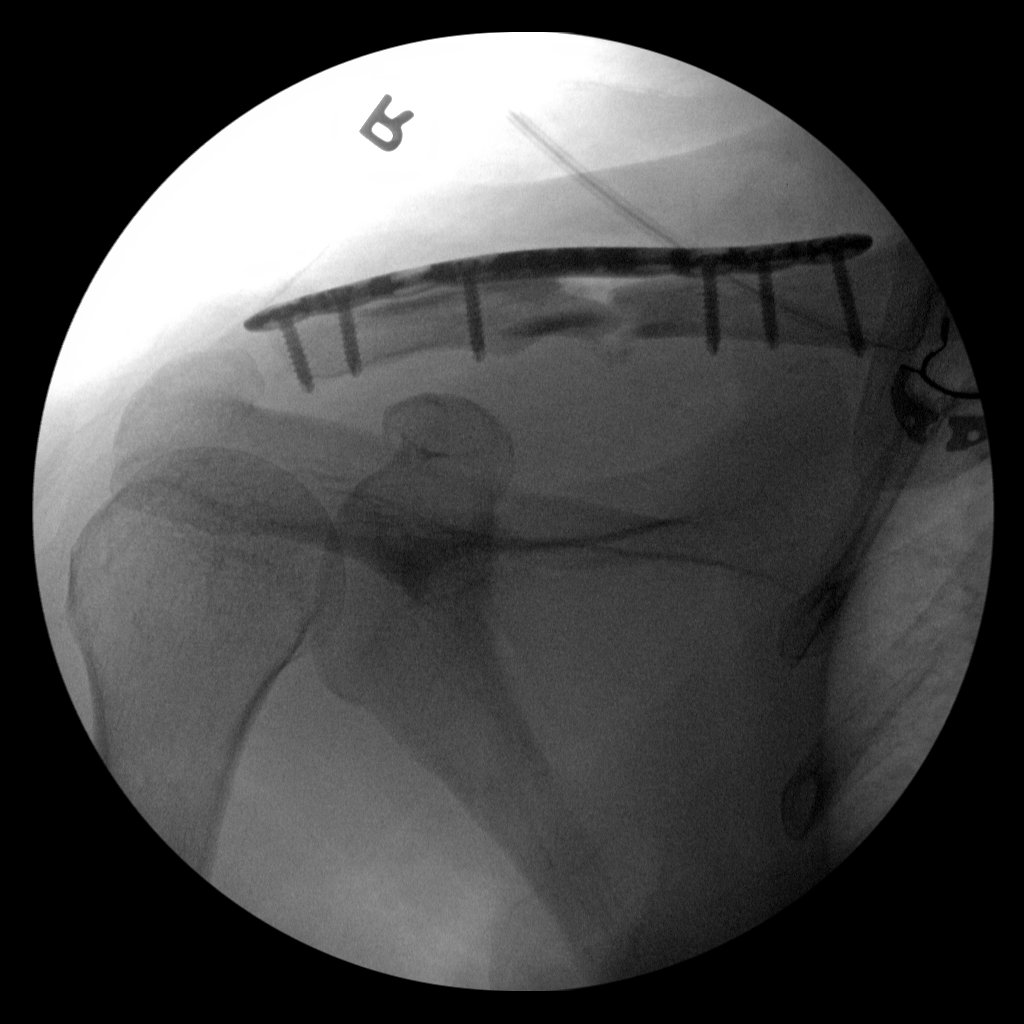
[im 4/4]
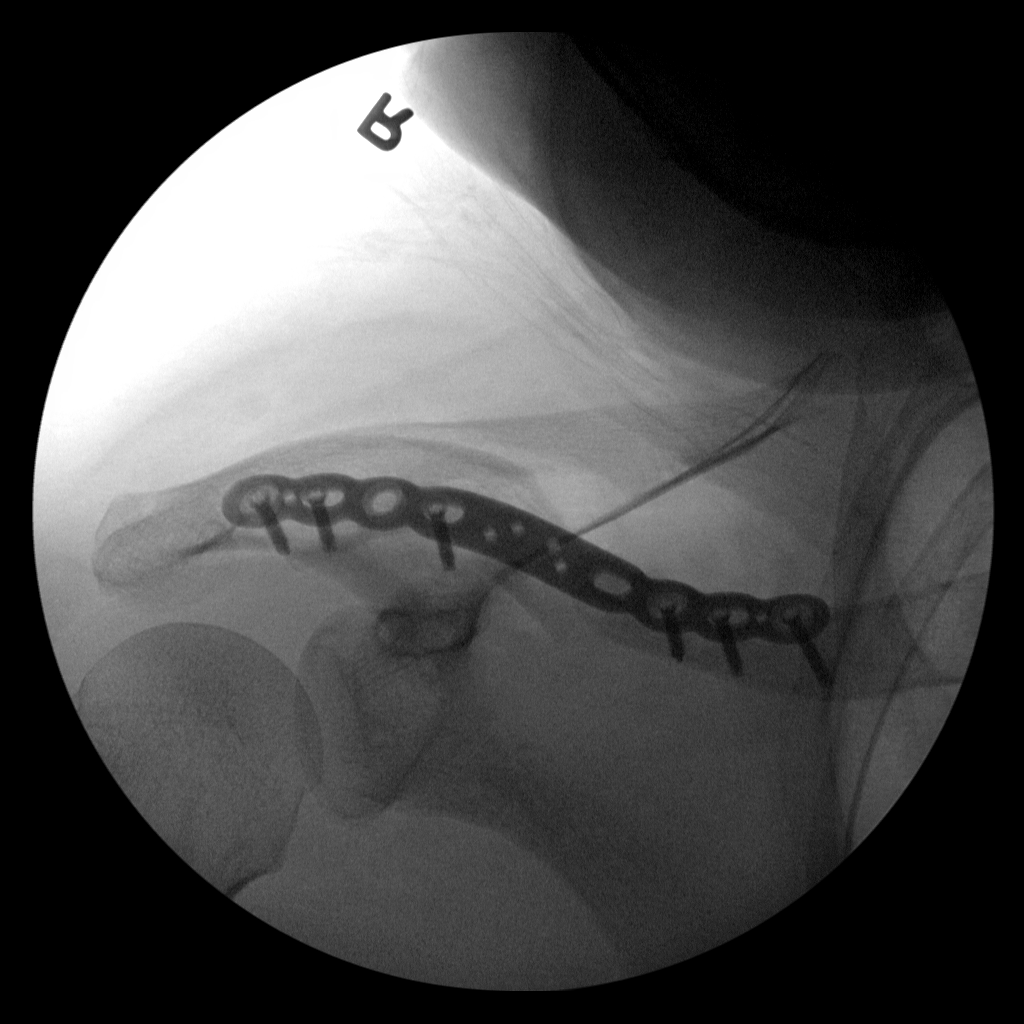

[4 of 4 positions shown; findings below may reference images not displayed]

FINDINGS: Four intraoperative fluoroscopic images demonstrate surgical
internal fixation of displaced right clavicular fracture. Good
alignment of fracture components is noted.
IMPRESSION: Status post surgical internal fixation of right clavicular fracture.
